# Patient Record
Sex: Female | Born: 1935 | Race: Black or African American | Hispanic: No | Marital: Single | Smoking: Never smoker
Health system: Southern US, Community
[De-identification: ages and names within clinical notes are randomized; demographics above are authoritative.]

## PROBLEM LIST (undated history)

## (undated) DIAGNOSIS — E119 Type 2 diabetes mellitus without complications: Secondary | ICD-10-CM

## (undated) DIAGNOSIS — I2699 Other pulmonary embolism without acute cor pulmonale: Secondary | ICD-10-CM

## (undated) DIAGNOSIS — F0390 Unspecified dementia without behavioral disturbance: Secondary | ICD-10-CM

## (undated) DIAGNOSIS — I82409 Acute embolism and thrombosis of unspecified deep veins of unspecified lower extremity: Secondary | ICD-10-CM

---

## 2010-08-09 ENCOUNTER — Emergency Department (HOSPITAL_COMMUNITY): Payer: Medicare Other

## 2010-08-09 ENCOUNTER — Emergency Department (HOSPITAL_COMMUNITY)
Admission: EM | Admit: 2010-08-09 | Discharge: 2010-08-09 | Disposition: A | Discharge status: Home / Self Care | Payer: Medicare Other | Attending: Emergency Medicine | Admitting: Emergency Medicine

## 2010-08-09 DIAGNOSIS — Z86711 Personal history of pulmonary embolism: Secondary | ICD-10-CM | POA: Insufficient documentation

## 2010-08-09 DIAGNOSIS — R29898 Other symptoms and signs involving the musculoskeletal system: Secondary | ICD-10-CM | POA: Insufficient documentation

## 2010-08-09 DIAGNOSIS — M19049 Primary osteoarthritis, unspecified hand: Secondary | ICD-10-CM | POA: Insufficient documentation

## 2010-08-09 DIAGNOSIS — E119 Type 2 diabetes mellitus without complications: Secondary | ICD-10-CM | POA: Insufficient documentation

## 2010-08-09 DIAGNOSIS — M79609 Pain in unspecified limb: Secondary | ICD-10-CM | POA: Insufficient documentation

## 2010-08-09 DIAGNOSIS — Z7901 Long term (current) use of anticoagulants: Secondary | ICD-10-CM | POA: Insufficient documentation

## 2010-08-09 DIAGNOSIS — M25439 Effusion, unspecified wrist: Secondary | ICD-10-CM | POA: Insufficient documentation

## 2010-08-09 DIAGNOSIS — R61 Generalized hyperhidrosis: Secondary | ICD-10-CM | POA: Insufficient documentation

## 2010-08-09 DIAGNOSIS — M25539 Pain in unspecified wrist: Secondary | ICD-10-CM | POA: Insufficient documentation

## 2010-08-09 DIAGNOSIS — M7989 Other specified soft tissue disorders: Secondary | ICD-10-CM | POA: Insufficient documentation

## 2010-08-09 LAB — DIFFERENTIAL
Eosinophils Absolute: 0.1 10*3/uL (ref 0.0–0.7)
Lymphs Abs: 1.7 10*3/uL (ref 0.7–4.0)
Monocytes Relative: 17 % — ABNORMAL HIGH (ref 3–12)
Neutro Abs: 3.6 10*3/uL (ref 1.7–7.7)
Neutrophils Relative %: 55 % (ref 43–77)

## 2010-08-09 LAB — BASIC METABOLIC PANEL
BUN: 12 mg/dL (ref 6–23)
CO2: 28 mEq/L (ref 19–32)
Calcium: 9.8 mg/dL (ref 8.4–10.5)
Creatinine, Ser: 0.69 mg/dL (ref 0.50–1.10)
Glucose, Bld: 88 mg/dL (ref 70–99)

## 2010-08-09 LAB — CBC
Hemoglobin: 11.1 g/dL — ABNORMAL LOW (ref 12.0–15.0)
MCH: 28 pg (ref 26.0–34.0)
MCV: 82.1 fL (ref 78.0–100.0)
RBC: 3.96 MIL/uL (ref 3.87–5.11)

## 2011-02-07 ENCOUNTER — Emergency Department (HOSPITAL_COMMUNITY)
Admission: EM | Admit: 2011-02-07 | Discharge: 2011-02-07 | Disposition: A | Discharge status: Home / Self Care | Payer: Medicare Other | Attending: Emergency Medicine | Admitting: Emergency Medicine

## 2011-02-07 ENCOUNTER — Emergency Department (HOSPITAL_COMMUNITY): Payer: Medicare Other

## 2011-02-07 ENCOUNTER — Encounter (HOSPITAL_COMMUNITY): Payer: Self-pay | Admitting: *Deleted

## 2011-02-07 DIAGNOSIS — M79609 Pain in unspecified limb: Secondary | ICD-10-CM

## 2011-02-07 DIAGNOSIS — Z86718 Personal history of other venous thrombosis and embolism: Secondary | ICD-10-CM | POA: Insufficient documentation

## 2011-02-07 DIAGNOSIS — M25469 Effusion, unspecified knee: Secondary | ICD-10-CM | POA: Insufficient documentation

## 2011-02-07 DIAGNOSIS — M7989 Other specified soft tissue disorders: Secondary | ICD-10-CM

## 2011-02-07 DIAGNOSIS — M25569 Pain in unspecified knee: Secondary | ICD-10-CM | POA: Insufficient documentation

## 2011-02-07 DIAGNOSIS — R609 Edema, unspecified: Secondary | ICD-10-CM | POA: Insufficient documentation

## 2011-02-07 HISTORY — DX: Acute embolism and thrombosis of unspecified deep veins of unspecified lower extremity: I82.409

## 2011-02-07 HISTORY — DX: Other pulmonary embolism without acute cor pulmonale: I26.99

## 2011-02-07 MED ORDER — ACETAMINOPHEN 325 MG PO TABS
650.0000 mg | ORAL_TABLET | Freq: Once | ORAL | Status: AC
  Administered 2011-02-07: 650 mg via ORAL
  Filled 2011-02-07: qty 2

## 2011-02-07 NOTE — ED Notes (Signed)
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

## 2011-02-07 NOTE — ED Provider Notes (Signed)
History     CSN: [id number]  Arrival date & time 02/07/11  1351   First MD Initiated Contact with Patient 02/07/11 1353      Chief Complaint  Patient presents with  . Knee Pain    right knee pain/edema    (Consider location/radiation/quality/duration/timing/severity/associated sxs/prior treatment) HPI History provided by pt and her daughter.  Patient's daughter reports that pt had a check-up w/ her PCP yesterday and was referred to ER for [place] to r/o RLE DVT.  They were unable to come d/t the weather.  Pt reports pain and edema of right knee.  She is not sure when sx started.  Her daughter does not recall her knee being swollen yesterday.  She had a fall prior to doctor's appointment but landed on her buttocks.  She is still bearing weight on this leg.  H/o DVT and PE; was taken off of coumadin approx 6 months ago.  Denies CP and SOB.      Past Medical History  Diagnosis Date  . DVT (deep venous thrombosis)   . Pulmonary embolism     History reviewed. No pertinent past surgical history.  History reviewed. No pertinent family history.  History  Substance Use Topics  . Smoking status: Not on file  . Smokeless tobacco: Not on file  . Alcohol Use: No    OB History    Grav Para Term Preterm Abortions TAB SAB Ect Mult Living                  Review of Systems  All other systems reviewed and are negative.    Allergies  Review of patient's allergies indicates no known allergies.  Home Medications  No current outpatient prescriptions on file.  BP 134/76  Pulse 76  Temp(Src) 97.8 F (36.6 C) (Oral)  Resp 18  SpO2 98%  Physical Exam  Nursing note and vitals reviewed. Constitutional: She is oriented to person, place, and time. She appears well-developed and well-nourished. No distress.  HENT:  Head: Normocephalic and atraumatic.  Eyes:       Normal appearance  Neck: Normal range of motion.  Cardiovascular: Normal rate and regular rhythm.   Pulmonary/Chest:  Effort normal and breath sounds normal.  Musculoskeletal:       Effusion of right knee.  No skin changes or warmth.  No edema or tenderness of calf, popliteal space or thigh.  Full ROM w/ mild pain.  2+ DP pulse and sensation intact.    Neurological: She is alert and oriented to person, place, and time.  Skin: Skin is warm and dry. No rash noted.  Psychiatric: She has a normal mood and affect. Her behavior is normal.    ED Course  Procedures (including critical care time)  Labs Reviewed - No data to display Dg Knee Complete 4 Views Right  02/07/2011  *RADIOLOGY REPORT*  Clinical Data: Pain and swelling in right knee, no known injury  RIGHT KNEE - COMPLETE 4+ VIEW  Comparison: None.  Findings:  Diffuse osteopenia without definite fracture.  Calcifications within the medial and lateral joint spaces compatible with chondrocalcinosis.  Joint spaces are grossly preserved.  No joint effusion.  Vascular calcifications.  IMPRESSION: 1.  Diffuse osteopenia without definite fracture. 2.  Chondrocalcinosis compatible with CPPD.  Original Report Authenticated By: Waynard Reeds, M.D.     No diagnosis found.    MDM  Pt presents w/ what appears to be a right knee joint effusion; likely has a sprain from  fall yesterday.  Has a h/o DVT and her PCP referred her to ED to have DVT ruled out.  Xray neg for fx.  Pt moved to CDU for LE venous doppler.  Will d/c home if negative.  Has received tylenol in ED and she has pain medication at home.          Otilio Miu, PA 02/07/11 1526  Arie Sabina [place], [place] 02/07/11 1526

## 2011-02-07 NOTE — Progress Notes (Signed)
Right:  No evidence of DVT, superficial thrombosis, or Baker's cyst.   Besse Miron D., RVS 02/07/2011 5:17PM

## 2011-02-07 NOTE — ED Notes (Signed)
MD at bedside at this time.

## 2011-02-07 NOTE — ED Provider Notes (Signed)
Medical screening examination/treatment/procedure(s) were conducted as a shared visit with non-physician practitioner(s) and myself.  I personally evaluated the patient during the encounter  Right knee effusion.  We'll give the patient orthopedic followup.  There is no signs of septic arthritis.  Given that the primary care doctor wanted an evaluation for DVT an ultrasound will be obtained however my suspicion for right lower committees DVT is very low  Lyanne Co, MD 02/07/11 [phone]

## 2011-02-07 NOTE — ED Notes (Signed)
Patient with c/o right knee pain, patient seen by PMD and was to have ultrasound yesterday but could not due to weather.  Patient with history of DVT/PE.  Patient was prescribed coumadin for past DVT but removed from drug approx 6 months ago

## 2011-02-07 NOTE — ED Provider Notes (Signed)
3:30 PM Assumed care of patient in the CDU.  Patient discussed with Annett Fabian, PA-C.  Patient is a 78 year old female who comes in today with right knee pain.  She was sent here by her PCP for evaluation of a possible DVT.  Patient had a negative knee xray.  Patient awaiting right lower extremity doppler.  Plan is for patient to be discharged home if Doppler is negative. Reassessed patient.  Patient in no acute distress.  Patient alert and orientated x 3.  Heart RRR, Lungs CTAB.  Mild right knee effusion, no erythema or warmth.  Full ROM of right knee.  No lower extremity edema, erythema, or tenderness..  Negative Homan's sign.  6:05 PM Lower extremity doppler of right LE negative.  Results discussed with patient and family.  Will discharge patient home and recommend follow up with PCP in a couple of days.  Pascal Lux [place], PA-C 02/16/11 1901

## 2011-02-16 NOTE — ED Provider Notes (Signed)
Medical screening examination/treatment/procedure(s) were conducted as a shared visit with non-physician practitioner(s) and myself.  I personally evaluated the patient during the encounter   Lyanne Co, MD 02/16/11 [phone]

## 2011-04-05 ENCOUNTER — Encounter (HOSPITAL_COMMUNITY): Payer: Self-pay | Admitting: Physical Medicine and Rehabilitation

## 2011-04-05 ENCOUNTER — Emergency Department (HOSPITAL_COMMUNITY)
Admission: EM | Admit: 2011-04-05 | Discharge: 2011-04-05 | Disposition: A | Discharge status: Home / Self Care | Payer: Medicare Other | Attending: Emergency Medicine | Admitting: Emergency Medicine

## 2011-04-05 ENCOUNTER — Emergency Department (HOSPITAL_COMMUNITY): Payer: Medicare Other

## 2011-04-05 ENCOUNTER — Other Ambulatory Visit: Payer: Self-pay

## 2011-04-05 DIAGNOSIS — R404 Transient alteration of awareness: Secondary | ICD-10-CM

## 2011-04-05 DIAGNOSIS — Z86718 Personal history of other venous thrombosis and embolism: Secondary | ICD-10-CM | POA: Insufficient documentation

## 2011-04-05 DIAGNOSIS — R4182 Altered mental status, unspecified: Secondary | ICD-10-CM | POA: Insufficient documentation

## 2011-04-05 LAB — BASIC METABOLIC PANEL
CO2: 23 mEq/L (ref 19–32)
Calcium: 9.8 mg/dL (ref 8.4–10.5)
Chloride: 99 mEq/L (ref 96–112)
Creatinine, Ser: 0.95 mg/dL (ref 0.50–1.10)
Glucose, Bld: 109 mg/dL — ABNORMAL HIGH (ref 70–99)
Sodium: 135 mEq/L (ref 135–145)

## 2011-04-05 LAB — URINALYSIS, ROUTINE W REFLEX MICROSCOPIC
Bilirubin Urine: NEGATIVE
Hgb urine dipstick: NEGATIVE
Specific Gravity, Urine: 1.015 (ref 1.005–1.030)
pH: 6 (ref 5.0–8.0)

## 2011-04-05 LAB — CBC
Hemoglobin: 13.9 g/dL (ref 12.0–15.0)
MCH: 29.4 pg (ref 26.0–34.0)
MCV: 87.5 fL (ref 78.0–100.0)
RBC: 4.72 MIL/uL (ref 3.87–5.11)

## 2011-04-05 LAB — URINE MICROSCOPIC-ADD ON

## 2011-04-05 NOTE — ED Notes (Signed)
Pt presents to department for evaluation of altered mental status. She was at home today with family when she had episode of garbled speech and was unable to answer questions. Daughter states this lasted several minutes. Also states pt began "rocking" back and forth. Upon arrival per daughter she returned to normal neuro baseline. She is alert and can answer simple questions, confused about situation. Strong equal bilateral grip strengths. Pupils round and reactive. Able to move all extremities. Speech clear. Denies pain at the time.

## 2011-04-05 NOTE — ED Provider Notes (Signed)
History     CSN: [id number]  Arrival date & time 04/05/11  1047   First MD Initiated Contact with Patient 04/05/11 1054      Chief Complaint  Patient presents with  . Altered Mental Status    (Consider location/radiation/quality/duration/timing/severity/associated sxs/prior treatment) HPI  Pt presents to the ED with complaints of altered mental status while going to the bathroom. Per the patients family members, she was going to the bathroom and for about 5 minutes she was rocking back and forth and mumbling. The patient has never done this before per the family member. Upon arrival to the ED the patient was back to baseline. At this time the patient is alert and oriented and is answering questions appropriately. She claims that she remembers what happened but does not know why. She denies chest pain or abdominal pain. She denies having any complaints at all.  Past Medical History  Diagnosis Date  . DVT (deep venous thrombosis)   . Pulmonary embolism     No past surgical history on file.  No family history on file.  History  Substance Use Topics  . Smoking status: Never Smoker   . Smokeless tobacco: Not on file  . Alcohol Use: No    OB History    Grav Para Term Preterm Abortions TAB SAB Ect Mult Living                  Review of Systems  Unable to perform ROS   Allergies  Review of patient's allergies indicates no known allergies.  Home Medications   Current Outpatient Rx  Name Route Sig Dispense Refill  . ACETAMINOPHEN 325 MG PO TABS Oral Take 650 mg by mouth every 6 (six) hours as needed. For pain    . AZITHROMYCIN 1 % OP SOLN Both Eyes Place 1 drop into both eyes 3 (three) times daily.    Marland Kitchen DEXTROMETHORPHAN POLISTIREX ER 30 MG/5ML PO LQCR Oral Take 60 mg by mouth 2 (two) times daily.    . ERYTHROMYCIN 5 MG/GM OP OINT Both Eyes Place 1 application into both eyes 2 (two) times daily.    Marland Kitchen FERROUS FUMARATE 325 (106 FE) MG PO TABS Oral Take 1 tablet by mouth.     Marland Kitchen LORATADINE 10 MG PO TABS Oral Take 10 mg by mouth daily.    Marland Kitchen VITAMIN C 500 MG PO TABS Oral Take 500 mg by mouth 2 (two) times daily.       BP 104/55  Pulse 76  Temp(Src) 98.6 F (37 C) (Axillary)  Resp 18  SpO2 96%  Physical Exam  Nursing note and vitals reviewed. Constitutional: She appears well-developed and well-nourished. No distress.  HENT:  Head: Normocephalic and atraumatic.  Eyes: Pupils are equal, round, and reactive to light.  Neck: Normal range of motion. Neck supple.  Cardiovascular: Normal rate and regular rhythm.   Pulmonary/Chest: Effort normal.  Abdominal: Soft.  Neurological: She is alert.  Skin: Skin is warm and dry. She is not diaphoretic.    ED Course  Procedures (including critical care time)  Labs Reviewed  URINALYSIS, ROUTINE W REFLEX MICROSCOPIC - Abnormal; Notable for the following:    Protein, ur 30 (*)    All other components within normal limits  CBC - Abnormal; Notable for the following:    Platelets 139 (*)    All other components within normal limits  BASIC METABOLIC PANEL - Abnormal; Notable for the following:    Glucose, Bld 109 (*)  GFR calc non Af Amer 51 (*)    GFR calc Af Amer 59 (*)    All other components within normal limits  URINE MICROSCOPIC-ADD ON - Abnormal; Notable for the following:    Bacteria, UA FEW (*)    Casts HYALINE CASTS (*)    All other components within normal limits  TROPONIN I   Ct Head Wo Contrast  04/05/2011  *RADIOLOGY REPORT*  Clinical Data: TIA, with slurred speech earlier today which has resolved.  Acute mental status changes currently with confusion.  CT HEAD WITHOUT CONTRAST 04/05/2011:  Technique:  Contiguous axial images were obtained from the base of the skull through the vertex without contrast.  Comparison: None.  Findings: Moderate cortical atrophy and mild deep atrophy.  Mild changes of small vessel disease of the white matter.  No mass lesion.  No midline shift.  No acute hemorrhage or  hematoma.  No extra-axial fluid collections.  No evidence of acute infarction. Physiologic calcifications in the basal ganglia bilaterally.  No focal osseous abnormality involving the skull.  Air-fluid level in the right maxillary sinus with opacification of bilateral ethmoid air cells.  Remaining visualized paranasal sinuses, mastoid air cells, and middle ear cavities well-aerated.  Extensive bilateral carotid siphon atherosclerosis.  Bilateral scleral buckle procedures.  IMPRESSION:  1.  No acute intracranial abnormalities. 2.  Moderate cortical atrophy and mild deep atrophy.  Mild chronic microvascular ischemic changes of white matter. 3.  Acute right maxillary sinus and acute versus chronic bilateral ethmoid sinusitis.  Original Report Authenticated By: Arnell Sieving, M.D.     1. Altered awareness, transient       MDM     Date: 04/05/2011  Rate: 83  Rhythm: normal sinus rhythm  QRS Axis: normal  Intervals: normal  ST/T Wave abnormalities: normal  Conduction Disutrbances:none and borderline AV conduction delay  Narrative Interpretation: Ventricular premature complex  Old EKG Reviewed: none available   Pt discussed with Dr. Karma Ganja who has examined the patient as well. Her work-up for AMS has come back with no significant findings. We believe that the patient is safe to go home at this time and should follow-up with her PCP this week.  Pt has been advised of the symptoms that warrant their return to the ED. Patient has voiced understanding and has agreed to follow-up with the PCP or specialist.       Dorthula Matas, PA 04/05/11 1338

## 2011-04-05 NOTE — ED Notes (Signed)
Pt presents to department for evaluation of altered mental status. Per family she was being assisted to bathroom when she became confused and was unable to answer questions. Episode lasting 10-15 seconds. Upon arrival she is alert and oriented x4. Denies pain.

## 2011-04-05 NOTE — ED Notes (Signed)
Pt back from CT-Returned to monitoring equipment and cycled BP 104/55

## 2011-04-05 NOTE — ED Notes (Signed)
Pt discharged home, wheelchair to discharge. Had no further questions.

## 2011-04-05 NOTE — ED Notes (Signed)
Pt resting quietly at the time. Remains on cardiac monitor. Vital signs stable. Denies pain at the time. No change in neuro status.

## 2011-04-05 NOTE — ED Notes (Signed)
Pt to CT scan via Tech

## 2011-04-05 NOTE — ED Provider Notes (Signed)
Medical screening examination/treatment/procedure(s) were conducted as a shared visit with non-physician practitioner(s) and myself.  I personally evaluated the patient during the encounter  Pt seen and evaluated.  Pt and family feel she is back to her baseline after a brief episode of mumbling and not answering questions at home.  Workup in ED reassuring- no acute abnormalities found.  Ethelda Chick, MD 04/05/11 [phone]

## 2011-07-27 ENCOUNTER — Other Ambulatory Visit: Payer: Self-pay | Admitting: Ophthalmology

## 2011-07-27 NOTE — H&P (Signed)
Pre-operative History and Physical for Ophthalmic Surgery  Roberta White 07/27/2011                  Chief Complaint: Decreased vision  Diagnosis: Mature Cataract  No Known Allergies    Prior to Admission medications   Medication Sig Start Date End Date Taking? Authorizing Provider  acetaminophen (TYLENOL) 325 MG tablet Take 650 mg by mouth every 6 (six) hours as needed. For pain    Historical Provider, MD  azithromycin (AZASITE) 1 % ophthalmic solution Place 1 drop into both eyes 3 (three) times daily.    Historical Provider, MD  dextromethorphan (DELSYM) 30 MG/5ML liquid Take 60 mg by mouth 2 (two) times daily.    Historical Provider, MD  erythromycin ophthalmic ointment Place 1 application into both eyes 2 (two) times daily.    Historical Provider, MD  ferrous fumarate (HEMOCYTE - 106 MG FE) 325 (106 FE) MG TABS Take 1 tablet by mouth.    Historical Provider, MD  loratadine (CLARITIN) 10 MG tablet Take 10 mg by mouth daily.    Historical Provider, MD  vitamin C (ASCORBIC ACID) 500 MG tablet Take 500 mg by mouth 2 (two) times daily.     Historical Provider, MD    Planned Procedure:                                       Phacoemulsification, Posterior Chamber Intra-ocular Lens Left Eye                                      Acrysof MA50BM  +19.50 Diopter PC IOL  There were no vitals filed for this visit.  Pulse: 80         Temp: NA        Resp:  18      ROS: negative   Past Medical History  Diagnosis Date  . DVT (deep venous thrombosis)   . Pulmonary embolism     No past surgical history on file.   History   Social History  . Marital Status: Single    Spouse Name: N/A    Number of Children: N/A  . Years of Education: N/A   Occupational History  . Not on file.   Social History Main Topics  . Smoking status: Never Smoker   . Smokeless tobacco: Not on file  . Alcohol Use: No  . Drug Use: No  . Sexually Active:    Other Topics Concern  . Not on file   Social  History Narrative  . No narrative on file     The following examination is for anesthesia clearance for minimally invasive Ophthalmic surgery. It is primarily to document heart and lung findings and is not intended to elucidate unknown general medical conditions inclusive of abdominal masses, lung lesions, etc.   General Constitution:  frail   Alertness/Orientation:  Person, time place     yes , alzheimer's  HEENT:  Eye Findings: Cataract                   left eye  Neck: supple without masses  Chest/Lungs: clear to auscultation  Cardiac: Normal S1 and S2 without Murmur, S3 or S4  Neuro: non-focal   Impression: cataract   Planned Procedure:  Phacoemulsification, Posterior Chamber Intraocular Lens  Left eye    Shade Flood, MD

## 2011-08-11 ENCOUNTER — Encounter (HOSPITAL_COMMUNITY): Payer: Self-pay

## 2011-08-11 ENCOUNTER — Encounter (HOSPITAL_COMMUNITY)
Admission: RE | Admit: 2011-08-11 | Discharge: 2011-08-11 | Payer: Medicare Other | Source: Ambulatory Visit | Attending: Ophthalmology | Admitting: Ophthalmology

## 2011-08-11 ENCOUNTER — Encounter (HOSPITAL_COMMUNITY): Payer: Self-pay | Admitting: Pharmacist

## 2011-08-11 HISTORY — DX: Unspecified dementia, unspecified severity, without behavioral disturbance, psychotic disturbance, mood disturbance, and anxiety: F03.90

## 2011-08-11 MED ORDER — GATIFLOXACIN 0.5 % OP SOLN
1.0000 [drp] | OPHTHALMIC | Status: AC | PRN
  Administered 2011-08-12 (×3): 1 [drp] via OPHTHALMIC
  Filled 2011-08-11: qty 2.5

## 2011-08-11 MED ORDER — PHENYLEPHRINE HCL 2.5 % OP SOLN
1.0000 [drp] | OPHTHALMIC | Status: AC | PRN
  Administered 2011-08-12 (×3): 1 [drp] via OPHTHALMIC
  Filled 2011-08-11: qty 3

## 2011-08-11 MED ORDER — TETRACAINE HCL 0.5 % OP SOLN
2.0000 [drp] | OPHTHALMIC | Status: AC
  Administered 2011-08-12: 2 [drp] via OPHTHALMIC
  Filled 2011-08-11: qty 2

## 2011-08-11 MED ORDER — PREDNISOLONE ACETATE 1 % OP SUSP
1.0000 [drp] | OPHTHALMIC | Status: AC
  Administered 2011-08-12: 1 [drp] via OPHTHALMIC
  Filled 2011-08-11: qty 5

## 2011-08-12 ENCOUNTER — Ambulatory Visit (HOSPITAL_COMMUNITY): Payer: Medicare Other | Admitting: Certified Registered"

## 2011-08-12 ENCOUNTER — Encounter (HOSPITAL_COMMUNITY): Payer: Self-pay | Admitting: Certified Registered"

## 2011-08-12 ENCOUNTER — Encounter (HOSPITAL_COMMUNITY)
Admission: RE | Disposition: A | Discharge status: Home / Self Care | Payer: Self-pay | Source: Ambulatory Visit | Attending: Ophthalmology

## 2011-08-12 ENCOUNTER — Ambulatory Visit (HOSPITAL_COMMUNITY): Payer: Medicare Other

## 2011-08-12 ENCOUNTER — Ambulatory Visit (HOSPITAL_COMMUNITY)
Admission: RE | Admit: 2011-08-12 | Discharge: 2011-08-12 | Disposition: A | Discharge status: Home / Self Care | Payer: Medicare Other | Source: Ambulatory Visit | Attending: Ophthalmology | Admitting: Ophthalmology

## 2011-08-12 ENCOUNTER — Encounter (HOSPITAL_COMMUNITY): Payer: Self-pay

## 2011-08-12 DIAGNOSIS — Z01818 Encounter for other preprocedural examination: Secondary | ICD-10-CM | POA: Insufficient documentation

## 2011-08-12 DIAGNOSIS — H268 Other specified cataract: Secondary | ICD-10-CM | POA: Insufficient documentation

## 2011-08-12 HISTORY — PX: CATARACT EXTRACTION W/PHACO: SHX586

## 2011-08-12 LAB — BASIC METABOLIC PANEL
BUN: 11 mg/dL (ref 6–23)
CO2: 31 mEq/L (ref 19–32)
Calcium: 9.9 mg/dL (ref 8.4–10.5)
GFR calc non Af Amer: 62 mL/min — ABNORMAL LOW (ref >90)
Glucose, Bld: 99 mg/dL (ref 70–99)
Potassium: 3.9 mEq/L (ref 3.5–5.1)

## 2011-08-12 LAB — CBC
Hemoglobin: 14.5 g/dL (ref 12.0–15.0)
MCH: 31 pg (ref 26.0–34.0)
MCHC: 35.5 g/dL (ref 30.0–36.0)
MCV: 87.6 fL (ref 78.0–100.0)

## 2011-08-12 SURGERY — PHACOEMULSIFICATION, CATARACT, WITH IOL INSERTION
Anesthesia: General | Site: Eye | Laterality: Left | Wound class: Clean Contaminated

## 2011-08-12 MED ORDER — PHENYLEPHRINE HCL 10 MG/ML IJ SOLN
INTRAMUSCULAR | Status: DC | PRN
  Administered 2011-08-12: 40 ug via INTRAVENOUS

## 2011-08-12 MED ORDER — NEOSTIGMINE METHYLSULFATE 1 MG/ML IJ SOLN
INTRAMUSCULAR | Status: DC | PRN
  Administered 2011-08-12: 4 mg via INTRAVENOUS

## 2011-08-12 MED ORDER — EPINEPHRINE HCL 1 MG/ML IJ SOLN
INTRAMUSCULAR | Status: AC
  Filled 2011-08-12: qty 1

## 2011-08-12 MED ORDER — GLYCOPYRROLATE 0.2 MG/ML IJ SOLN
INTRAMUSCULAR | Status: DC | PRN
  Administered 2011-08-12: .6 mg via INTRAVENOUS

## 2011-08-12 MED ORDER — ONDANSETRON HCL 4 MG/2ML IJ SOLN
INTRAMUSCULAR | Status: DC | PRN
  Administered 2011-08-12: 4 mg via INTRAVENOUS

## 2011-08-12 MED ORDER — SODIUM CHLORIDE 0.9 % IV SOLN
INTRAVENOUS | Status: DC | PRN
  Administered 2011-08-12: 15:00:00 via INTRAVENOUS

## 2011-08-12 MED ORDER — BACITRACIN-POLYMYXIN B 500-10000 UNIT/GM OP OINT
TOPICAL_OINTMENT | OPHTHALMIC | Status: AC
  Filled 2011-08-12: qty 3.5

## 2011-08-12 MED ORDER — BSS IO SOLN
INTRAOCULAR | Status: AC
  Filled 2011-08-12: qty 500

## 2011-08-12 MED ORDER — TRYPAN BLUE 0.15 % OP SOLN
1.0000 "application " | Freq: Once | OPHTHALMIC | Status: AC
  Administered 2011-08-12: 1 via INTRAOCULAR
  Filled 2011-08-12: qty 0.5

## 2011-08-12 MED ORDER — SODIUM CHLORIDE 0.9 % IV SOLN
INTRAVENOUS | Status: DC
  Administered 2011-08-12: 14:00:00 via INTRAVENOUS

## 2011-08-12 MED ORDER — LIDOCAINE HCL (CARDIAC) 20 MG/ML IV SOLN
INTRAVENOUS | Status: DC | PRN
  Administered 2011-08-12: 100 mg via INTRAVENOUS

## 2011-08-12 MED ORDER — SODIUM HYALURONATE 10 MG/ML IO SOLN
INTRAOCULAR | Status: AC
  Filled 2011-08-12: qty 0.85

## 2011-08-12 MED ORDER — EPINEPHRINE HCL 1 MG/ML IJ SOLN
INTRAOCULAR | Status: DC | PRN
  Administered 2011-08-12: 15:00:00

## 2011-08-12 MED ORDER — EPHEDRINE SULFATE 50 MG/ML IJ SOLN
INTRAMUSCULAR | Status: DC | PRN
  Administered 2011-08-12 (×2): 5 mg via INTRAVENOUS

## 2011-08-12 MED ORDER — FENTANYL CITRATE 0.05 MG/ML IJ SOLN
INTRAMUSCULAR | Status: DC | PRN
  Administered 2011-08-12: 50 ug via INTRAVENOUS

## 2011-08-12 MED ORDER — NA CHONDROIT SULF-NA HYALURON 40-30 MG/ML IO SOLN
INTRAOCULAR | Status: DC | PRN
  Administered 2011-08-12: 0.5 mL via INTRAOCULAR

## 2011-08-12 MED ORDER — BUPIVACAINE HCL 0.75 % IJ SOLN
INTRAMUSCULAR | Status: DC | PRN
  Administered 2011-08-12: 4 mL

## 2011-08-12 MED ORDER — PROVISC 10 MG/ML IO SOLN
INTRAOCULAR | Status: DC | PRN
  Administered 2011-08-12: .85 mL via INTRAOCULAR

## 2011-08-12 MED ORDER — DEXAMETHASONE SODIUM PHOSPHATE 10 MG/ML IJ SOLN
INTRAMUSCULAR | Status: DC | PRN
  Administered 2011-08-12: 5 mg

## 2011-08-12 MED ORDER — PROPOFOL 10 MG/ML IV EMUL
INTRAVENOUS | Status: DC | PRN
  Administered 2011-08-12: 90 mg via INTRAVENOUS

## 2011-08-12 MED ORDER — ROCURONIUM BROMIDE 100 MG/10ML IV SOLN
INTRAVENOUS | Status: DC | PRN
  Administered 2011-08-12: 25 mg via INTRAVENOUS

## 2011-08-12 MED ORDER — LIDOCAINE HCL 2 % IJ SOLN
INTRAMUSCULAR | Status: AC
  Filled 2011-08-12: qty 1

## 2011-08-12 MED ORDER — BUPIVACAINE HCL 0.75 % IJ SOLN
INTRAMUSCULAR | Status: AC
  Filled 2011-08-12: qty 10

## 2011-08-12 MED ORDER — NA CHONDROIT SULF-NA HYALURON 40-30 MG/ML IO SOLN
INTRAOCULAR | Status: AC
  Filled 2011-08-12: qty 0.5

## 2011-08-12 MED ORDER — CEFAZOLIN SUBCONJUNCTIVAL INJECTION 100 MG/0.5 ML
200.0000 mg | INJECTION | Freq: Once | SUBCONJUNCTIVAL | Status: AC
  Administered 2011-08-12: 100 mg via SUBCONJUNCTIVAL
  Filled 2011-08-12: qty 1

## 2011-08-12 MED ORDER — MIDAZOLAM HCL 2 MG/2ML IJ SOLN
INTRAMUSCULAR | Status: AC
  Filled 2011-08-12: qty 2

## 2011-08-12 MED ORDER — HYPROMELLOSE (GONIOSCOPIC) 2.5 % OP SOLN
OPHTHALMIC | Status: DC | PRN
  Administered 2011-08-12: 15 [drp] via OPHTHALMIC

## 2011-08-12 MED ORDER — BACITRACIN-POLYMYXIN B 500-10000 UNIT/GM OP OINT
TOPICAL_OINTMENT | OPHTHALMIC | Status: DC | PRN
  Administered 2011-08-12: 1 via OPHTHALMIC

## 2011-08-12 MED ORDER — HYDROMORPHONE HCL PF 1 MG/ML IJ SOLN: 0.2500 mg | INTRAMUSCULAR | Status: DC | PRN

## 2011-08-12 MED ORDER — TETRACAINE HCL 0.5 % OP SOLN
OPHTHALMIC | Status: AC
  Filled 2011-08-12: qty 2

## 2011-08-12 MED ORDER — MIDAZOLAM HCL 2 MG/2ML IJ SOLN
0.5000 mg | Freq: Once | INTRAMUSCULAR | Status: AC
  Administered 2011-08-12: 0.5 mg via INTRAVENOUS

## 2011-08-12 MED ORDER — ONDANSETRON HCL 4 MG/2ML IJ SOLN: 4.0000 mg | Freq: Once | INTRAMUSCULAR | Status: DC | PRN

## 2011-08-12 MED ORDER — LIDOCAINE HCL 4 % MT SOLN
OROMUCOSAL | Status: DC | PRN
  Administered 2011-08-12: 4 mL via TOPICAL

## 2011-08-12 MED ORDER — HYPROMELLOSE (GONIOSCOPIC) 2.5 % OP SOLN
OPHTHALMIC | Status: AC
  Filled 2011-08-12: qty 15

## 2011-08-12 MED ORDER — DEXAMETHASONE SODIUM PHOSPHATE 10 MG/ML IJ SOLN
INTRAMUSCULAR | Status: AC
  Filled 2011-08-12: qty 1

## 2011-08-12 MED ORDER — BALANCED SALT IO SOLN
INTRAOCULAR | Status: DC | PRN
  Administered 2011-08-12: 15 mL via INTRAOCULAR

## 2011-08-12 SURGICAL SUPPLY — 59 items
APPLICATOR COTTON TIP 6IN STRL (MISCELLANEOUS) ×2 IMPLANT
APPLICATOR DR MATTHEWS STRL (MISCELLANEOUS) IMPLANT
BAG MINI COLL DRAIN (WOUND CARE) ×2 IMPLANT
BLADE EYE MINI 60D BEAVER (BLADE) IMPLANT
BLADE KERATOME 2.75 (BLADE) ×2 IMPLANT
BLADE STAB KNIFE 15DEG (BLADE) ×2 IMPLANT
CANNULA ANTERIOR CHAMBER 27GA (MISCELLANEOUS) ×2 IMPLANT
CLOTH BEACON ORANGE TIMEOUT ST (SAFETY) ×2 IMPLANT
DRAPE OPHTHALMIC 77X100 STRL (CUSTOM PROCEDURE TRAY) ×2 IMPLANT
DRAPE POUCH INSTRU U-SHP 10X18 (DRAPES) ×2 IMPLANT
DRSG TEGADERM 4X4.75 (GAUZE/BANDAGES/DRESSINGS) ×2 IMPLANT
FILTER BLUE MILLIPORE (MISCELLANEOUS) IMPLANT
GLOVE ECLIPSE 6.5 STRL STRAW (GLOVE) ×2 IMPLANT
GLOVE SS BIOGEL STRL SZ 6.5 (GLOVE) ×1 IMPLANT
GLOVE SUPERSENSE BIOGEL SZ 6.5 (GLOVE) ×1
GOWN SRG XL XLNG 56XLVL 4 (GOWN DISPOSABLE) ×1 IMPLANT
GOWN STRL NON-REIN LRG LVL3 (GOWN DISPOSABLE) ×2 IMPLANT
GOWN STRL NON-REIN XL XLG LVL4 (GOWN DISPOSABLE) ×1
KIT BASIN OR (CUSTOM PROCEDURE TRAY) ×2 IMPLANT
KIT ROOM TURNOVER OR (KITS) IMPLANT
KNIFE GRIESHABER SHARP 2.5MM (MISCELLANEOUS) ×2 IMPLANT
LENS IOL ACRYSOF MP POST 19.5 (Intraocular Lens) ×2 IMPLANT
MASK EYE SHIELD (GAUZE/BANDAGES/DRESSINGS) ×2 IMPLANT
NEEDLE 18GX1X1/2 (RX/OR ONLY) (NEEDLE) IMPLANT
NEEDLE 22X1 1/2 (OR ONLY) (NEEDLE) ×2 IMPLANT
NEEDLE 25GX 5/8IN NON SAFETY (NEEDLE) ×2 IMPLANT
NEEDLE FILTER BLUNT 18X 1/2SAF (NEEDLE) ×1
NEEDLE FILTER BLUNT 18X1 1/2 (NEEDLE) ×1 IMPLANT
NEEDLE HYPO 30X.5 LL (NEEDLE) ×4 IMPLANT
NS IRRIG 1000ML POUR BTL (IV SOLUTION) ×2 IMPLANT
PACK CATARACT CUSTOM (CUSTOM PROCEDURE TRAY) ×2 IMPLANT
PACK CATARACT MCHSCP (PACKS) ×2 IMPLANT
PACK COMBINED CATERACT/VIT 23G (OPHTHALMIC RELATED) IMPLANT
PAD ARMBOARD 7.5X6 YLW CONV (MISCELLANEOUS) ×4 IMPLANT
PAD EYE OVAL STERILE LF (GAUZE/BANDAGES/DRESSINGS) ×2 IMPLANT
PHACO TIP KELMAN 45DEG (TIP) ×2 IMPLANT
PROBE ANTERIOR 20G W/INFUS NDL (MISCELLANEOUS) IMPLANT
ROLLS DENTAL (MISCELLANEOUS) IMPLANT
SHUTTLE MONARCH TYPE A (NEEDLE) ×2 IMPLANT
SOLUTION ANTI FOG 6CC (MISCELLANEOUS) ×2 IMPLANT
SPEAR EYE SURG WECK-CEL (MISCELLANEOUS) ×2 IMPLANT
SUT ETHILON 10-0 CS-B-6CS-B-6 (SUTURE)
SUT ETHILON 5 0 P 3 18 (SUTURE)
SUT ETHILON 9 0 TG140 8 (SUTURE) IMPLANT
SUT NYLON ETHILON 5-0 P-3 1X18 (SUTURE) IMPLANT
SUT PLAIN 6 0 TG1408 (SUTURE) IMPLANT
SUT POLY NON ABSORB 10-0 8 STR (SUTURE) IMPLANT
SUT VICRYL 6 0 S 29 12 (SUTURE) IMPLANT
SUTURE EHLN 10-0 CS-B-6CS-B-6 (SUTURE) IMPLANT
SYR 20CC LL (SYRINGE) IMPLANT
SYR 5ML LL (SYRINGE) IMPLANT
SYR TB 1ML LUER SLIP (SYRINGE) IMPLANT
SYRINGE 10CC LL (SYRINGE) IMPLANT
TAPE PAPER 2X10 WHT MICROPORE (GAUZE/BANDAGES/DRESSINGS) ×2 IMPLANT
TAPE SURG TRANSPORE 1 IN (GAUZE/BANDAGES/DRESSINGS) ×1 IMPLANT
TAPE SURGICAL TRANSPORE 1 IN (GAUZE/BANDAGES/DRESSINGS) ×1
TOWEL OR 17X24 6PK STRL BLUE (TOWEL DISPOSABLE) ×4 IMPLANT
WATER STERILE IRR 1000ML POUR (IV SOLUTION) ×2 IMPLANT
WIPE INSTRUMENT VISIWIPE 73X73 (MISCELLANEOUS) ×2 IMPLANT

## 2011-08-12 NOTE — Transfer of Care (Signed)
Immediate Anesthesia Transfer of Care Note  Patient: Roberta White  Procedure(s) Performed: Procedure(s) (LRB): CATARACT EXTRACTION PHACO AND INTRAOCULAR LENS PLACEMENT (IOC) (Left)  Patient Location: PACU  Anesthesia Type: General  Level of Consciousness: confused  Airway & Oxygen Therapy: Patient connected to face mask oxygen  Post-op Assessment: Report given to PACU RN and Post -op Vital signs reviewed and stable  Post vital signs: Reviewed and stable  Complications: No apparent anesthesia complications

## 2011-08-12 NOTE — H&P (View-Only) (Signed)
Pre-operative History and Physical for Ophthalmic Surgery  Samah Hoffmann 07/27/2011                  Chief Complaint: Decreased vision  Diagnosis: Mature Cataract  No Known Allergies    Prior to Admission medications   Medication Sig Start Date End Date Taking? Authorizing Provider  acetaminophen (TYLENOL) 325 MG tablet Take 650 mg by mouth every 6 (six) hours as needed. For pain    Historical Provider, MD  azithromycin (AZASITE) 1 % ophthalmic solution Place 1 drop into both eyes 3 (three) times daily.    Historical Provider, MD  dextromethorphan (DELSYM) 30 MG/5ML liquid Take 60 mg by mouth 2 (two) times daily.    Historical Provider, MD  erythromycin ophthalmic ointment Place 1 application into both eyes 2 (two) times daily.    Historical Provider, MD  ferrous fumarate (HEMOCYTE - 106 MG FE) 325 (106 FE) MG TABS Take 1 tablet by mouth.    Historical Provider, MD  loratadine (CLARITIN) 10 MG tablet Take 10 mg by mouth daily.    Historical Provider, MD  vitamin C (ASCORBIC ACID) 500 MG tablet Take 500 mg by mouth 2 (two) times daily.     Historical Provider, MD    Planned Procedure:                                       Phacoemulsification, Posterior Chamber Intra-ocular Lens Left Eye                                      Acrysof MA50BM  +19.50 Diopter PC IOL  There were no vitals filed for this visit.  Pulse: 80         Temp: NA        Resp:  18      ROS: negative   Past Medical History  Diagnosis Date  . DVT (deep venous thrombosis)   . Pulmonary embolism     No past surgical history on file.   History   Social History  . Marital Status: Single    Spouse Name: N/A    Number of Children: N/A  . Years of Education: N/A   Occupational History  . Not on file.   Social History Main Topics  . Smoking status: Never Smoker   . Smokeless tobacco: Not on file  . Alcohol Use: No  . Drug Use: No  . Sexually Active:    Other Topics Concern  . Not on file   Social  History Narrative  . No narrative on file     The following examination is for anesthesia clearance for minimally invasive Ophthalmic surgery. It is primarily to document heart and lung findings and is not intended to elucidate unknown general medical conditions inclusive of abdominal masses, lung lesions, etc.   General Constitution:  frail   Alertness/Orientation:  Person, time place     yes , alzheimer's  HEENT:  Eye Findings: Cataract                   left eye  Neck: supple without masses  Chest/Lungs: clear to auscultation  Cardiac: Normal S1 and S2 without Murmur, S3 or S4  Neuro: non-focal   Impression: cataract   Planned Procedure:  Phacoemulsification, Posterior Chamber Intraocular Lens                                         Left eye    Arval Brandstetter, MD        

## 2011-08-12 NOTE — Anesthesia Preprocedure Evaluation (Addendum)
Anesthesia Evaluation  Patient identified by MRN, date of birth, ID band Patient awake and Patient confused    Reviewed: Allergy & Precautions, H&P , NPO status , Patient's Chart, lab work & pertinent test results, reviewed documented beta blocker date and time   Airway Mallampati: II TM Distance: >3 FB Neck ROM: Full    Dental  (+) Dental Advisory Given   Pulmonary          Cardiovascular Exercise Tolerance: Poor DVT Rhythm:regular Rate:Normal     Neuro/Psych PSYCHIATRIC DISORDERS    GI/Hepatic   Endo/Other    Renal/GU      Musculoskeletal   Abdominal   Peds  Hematology   Anesthesia Other Findings Dementia  Reproductive/Obstetrics                          Anesthesia Physical Anesthesia Plan  ASA: II  Anesthesia Plan: General   Post-op Pain Management:    Induction: Intravenous  Airway Management Planned: Oral ETT  Additional Equipment:   Intra-op Plan:   Post-operative Plan: Extubation in OR  Informed Consent: I have reviewed the patients History and Physical, chart, labs and discussed the procedure including the risks, benefits and alternatives for the proposed anesthesia with the patient or authorized representative who has indicated his/her understanding and acceptance.     Plan Discussed with: CRNA, Anesthesiologist and Surgeon  Anesthesia Plan Comments:         Anesthesia Quick Evaluation

## 2011-08-12 NOTE — Preoperative (Signed)
Beta Blockers   Reason not to administer Beta Blockers:Not Applicable 

## 2011-08-12 NOTE — Op Note (Signed)
Roberta White 08/12/2011 Combined Cataract  Procedure: Phacoemulsification, Posterior Chamber Intra-ocular Lens Operative Eye:  left eye  Surgeon: Shade Flood Estimated Blood Loss: minimal Specimens for Pathology:  None Complications: none  The patient was prepared and draped in the usual manner for ocular surgery on the left eye. A Cook lid speculum was placed. A peripheral clear corneal incision was made at the surgical limbus centered at the 11:00 meridian. A separate clear corneal stab incision was made with a 15 degree blade at the 2:00 meridian to permit bi-manual technique. Provisc was instilled into the anterior chamber through that incision.  A keratome was used to create a self sealing incision entering the anterior chamber at the 11:00 meridian. A capsulorhexis was performed using a bent 25g needle. The lens was hydrodissected and the nucleus was hydrodilineated using a Nichammin cannula. The Chang chopper was inserted and used to rotate the lens to insure adequate lens mobility. The phacoemulsification handpiece was inserted and a combined phaco-chop technique was employed, fracturing the lens into separate sections with subsequent removal with the phaco handpiece.   The I/A cannula was used to remove remaining lens cortex. Provisc was instilled and used to deepen the anterior chamber and posterior capsule bag. The Monarch injector was used to place a folded Acrysof MA50BM PC IOL, + 19.50  diopters, into the capsule bag. A Sinskey lens hook was used to dial in the trailing haptic.  The I/A cannula was used to remove the viscoelastic from the anterior chamber. BSS was used to bring IOP to the desired range and the wound was checked to insure it was watertight. Subconjunctival injections of Ance 100/0.67ml and Dexamethasone 4mg /50ml were placed without complication. The lid speculum and drapes were removed and the patient's eye was patched with Polymixin/Bacitracin ophthalmic ointment. An eye  shield was placed and the patient was transferred alert and conversant from the operating room to the post-operative recovery area.   Shade Flood, MD

## 2011-08-12 NOTE — Anesthesia Postprocedure Evaluation (Signed)
  Anesthesia Post-op Note  Patient: Roberta White  Procedure(s) Performed: Procedure(s) (LRB): CATARACT EXTRACTION PHACO AND INTRAOCULAR LENS PLACEMENT (IOC) (Left)  Patient Location: PACU  Anesthesia Type: MAC  Level of Consciousness: awake  Airway and Oxygen Therapy: Patient Spontanous Breathing  Post-op Pain: mild  Post-op Assessment: Post-op Vital signs reviewed  Post-op Vital Signs: Reviewed  Complications: No apparent anesthesia complications

## 2011-08-12 NOTE — Anesthesia Procedure Notes (Signed)
Procedure Name: Intubation Date/Time: 08/12/2011 3:15 PM Performed by: Molli Hazard Pre-anesthesia Checklist: Patient identified, Emergency Drugs available, Suction available and Patient being monitored Patient Re-evaluated:Patient Re-evaluated prior to inductionOxygen Delivery Method: Circle system utilized Preoxygenation: Pre-oxygenation with 100% oxygen Intubation Type: IV induction Ventilation: Mask ventilation without difficulty Laryngoscope Size: Miller and 2 Grade View: Grade I Tube type: Oral Tube size: 7.5 mm Number of attempts: 1 Airway Equipment and Method: Stylet Placement Confirmation: ETT inserted through vocal cords under direct vision,  positive ETCO2 and breath sounds checked- equal and bilateral Secured at: 22 cm Tube secured with: Tape Dental Injury: Teeth and Oropharynx as per pre-operative assessment

## 2011-08-12 NOTE — Interval H&P Note (Signed)
History and Physical Interval Note:  08/12/2011 2:25 PM  Roberta White  has presented today for surgery, with the diagnosis of Cataract   The various methods of treatment have been discussed with the patient and family. After consideration of risks, benefits and other options for treatment, the patient has consented to  Procedure(s) (LRB): CATARACT EXTRACTION PHACO AND INTRAOCULAR LENS PLACEMENT (IOC) (Left) as a surgical intervention .  The patient's history has been reviewed, patient examined, no change in status, stable for surgery.  I have reviewed the patient's chart and labs.  Questions were answered to the patient's satisfaction.     Shade Flood, MD

## 2011-08-13 ENCOUNTER — Encounter (HOSPITAL_COMMUNITY): Payer: Self-pay | Admitting: Ophthalmology

## 2012-05-31 ENCOUNTER — Encounter (HOSPITAL_COMMUNITY): Payer: Self-pay | Admitting: Emergency Medicine

## 2012-05-31 ENCOUNTER — Emergency Department (HOSPITAL_COMMUNITY)
Admission: EM | Admit: 2012-05-31 | Discharge: 2012-05-31 | Disposition: A | Discharge status: Home / Self Care | Payer: Medicare Other | Attending: Emergency Medicine | Admitting: Emergency Medicine

## 2012-05-31 DIAGNOSIS — Z79899 Other long term (current) drug therapy: Secondary | ICD-10-CM | POA: Insufficient documentation

## 2012-05-31 DIAGNOSIS — Z86718 Personal history of other venous thrombosis and embolism: Secondary | ICD-10-CM | POA: Insufficient documentation

## 2012-05-31 DIAGNOSIS — Z86711 Personal history of pulmonary embolism: Secondary | ICD-10-CM | POA: Insufficient documentation

## 2012-05-31 DIAGNOSIS — F29 Unspecified psychosis not due to a substance or known physiological condition: Secondary | ICD-10-CM | POA: Insufficient documentation

## 2012-05-31 DIAGNOSIS — N390 Urinary tract infection, site not specified: Secondary | ICD-10-CM

## 2012-05-31 DIAGNOSIS — F0390 Unspecified dementia without behavioral disturbance: Secondary | ICD-10-CM

## 2012-05-31 LAB — URINE MICROSCOPIC-ADD ON

## 2012-05-31 LAB — BASIC METABOLIC PANEL
CO2: 28 mEq/L (ref 19–32)
Calcium: 9.9 mg/dL (ref 8.4–10.5)
GFR calc non Af Amer: 57 mL/min — ABNORMAL LOW (ref >90)
Potassium: 3.5 mEq/L (ref 3.5–5.1)
Sodium: 141 mEq/L (ref 135–145)

## 2012-05-31 LAB — CBC WITH DIFFERENTIAL/PLATELET
Basophils Absolute: 0 10*3/uL (ref 0.0–0.1)
Eosinophils Absolute: 0.1 10*3/uL (ref 0.0–0.7)
Eosinophils Relative: 1 % (ref 0–5)
Lymphocytes Relative: 39 % (ref 12–46)
MCV: 85.7 fL (ref 78.0–100.0)
Neutrophils Relative %: 49 % (ref 43–77)
Platelets: 221 10*3/uL (ref 150–400)
RDW: 13.7 % (ref 11.5–15.5)
WBC: 6.2 10*3/uL (ref 4.0–10.5)

## 2012-05-31 LAB — URINALYSIS, ROUTINE W REFLEX MICROSCOPIC
Protein, ur: NEGATIVE mg/dL
Urobilinogen, UA: 0.2 mg/dL (ref 0.0–1.0)

## 2012-05-31 MED ORDER — CEPHALEXIN 250 MG PO CAPS: 250.0000 mg | ORAL_CAPSULE | Freq: Four times a day (QID) | ORAL | Status: DC

## 2012-05-31 MED ORDER — CEPHALEXIN 250 MG PO CAPS
250.0000 mg | ORAL_CAPSULE | Freq: Once | ORAL | Status: AC
  Administered 2012-05-31: 250 mg via ORAL
  Filled 2012-05-31: qty 1
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
## 2012-05-31 NOTE — ED Notes (Signed)
Social Work is not available and will not be in to see patient until after 10am.

## 2012-05-31 NOTE — ED Notes (Signed)
Pt's daughter states that the pt has been "running away from home".  States that she has been "fussing" a lot.  Has a known eye condition that she is being treated for.  Pt is only oriented to self.  Does not know birthday, day of the week, month of the year, location, president, etc.
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
## 2012-05-31 NOTE — ED Notes (Signed)
ZOX:[id number]<AV> Expected date:<BR> Expected time:<BR> Means of arrival:<BR> Comments:<BR> Hold for triage 3

## 2012-06-02 LAB — URINE CULTURE

## 2012-06-03 ENCOUNTER — Telehealth (HOSPITAL_COMMUNITY): Payer: Self-pay | Admitting: Emergency Medicine

## 2012-06-03 NOTE — ED Notes (Signed)
Post ED Visit - Positive Culture Follow-up  Culture report reviewed by antimicrobial stewardship pharmacist: []  Wes Dulaney, Pharm.D., BCPS [x]  Celedonio Miyamoto, Pharm.D., BCPS []  Georgina Pillion, [address].D., BCPS []  [place], [address].D., BCPS, AAHIVP []  Estella Husk, Pharm.D., BCPS, AAHIV  Positive urine culture Treated with Keflex, organism sensitive to the same and no further patient follow-up is required at this time.  Kylie A Holland 06/03/2012, 5:51 PM

## 2013-03-11 ENCOUNTER — Emergency Department (HOSPITAL_COMMUNITY)
Admission: EM | Admit: 2013-03-11 | Discharge: 2013-03-11 | Disposition: A | Discharge status: Home / Self Care | Payer: Medicare Other | Source: Home / Self Care | Attending: Emergency Medicine | Admitting: Emergency Medicine

## 2013-03-11 ENCOUNTER — Encounter (HOSPITAL_COMMUNITY): Payer: Self-pay | Admitting: Emergency Medicine

## 2013-03-11 DIAGNOSIS — I739 Peripheral vascular disease, unspecified: Secondary | ICD-10-CM

## 2013-03-11 DIAGNOSIS — M21611 Bunion of right foot: Secondary | ICD-10-CM

## 2013-03-11 DIAGNOSIS — L600 Ingrowing nail: Secondary | ICD-10-CM

## 2013-03-11 DIAGNOSIS — M21612 Bunion of left foot: Secondary | ICD-10-CM

## 2013-03-11 DIAGNOSIS — L602 Onychogryphosis: Secondary | ICD-10-CM

## 2013-03-11 MED ORDER — HYDROCODONE-ACETAMINOPHEN 5-325 MG PO TABS: 1.0000 | ORAL_TABLET | Freq: Four times a day (QID) | ORAL | Status: DC | PRN

## 2013-03-11 NOTE — ED Notes (Signed)
C/o bil foot pain x 1 week and swelling for 2 days.

## 2013-03-11 NOTE — Discharge Instructions (Signed)
You need to be evaluated by a vascular doctor for the diminished circulation in your legs and feet. You also need to be evaluated by a foot doctor (Podiatrist) to have your bunions treated and your nails trimmed. Until these visits follow instructions below to help with discomfort and take the medication as directed for pain.    Bunion You have a bunion deformity of the feet. This is more common in women. It tends to be an inherited problem. Symptoms can include pain, swelling, and deformity around the great toe. Numbness and tingling may also be present. Your symptoms are often worsened by wearing shoes that cause pressure on the bunion. Changing the type of shoes you wear helps reduce symptoms. A wide shoe decreases pressure on the bunion. An arch support may be used if you have flat feet. Avoid shoes with heels higher than two inches. This puts more pressure on the bunion. X-rays may be helpful in evaluating the severity of the problem. Other foot problems often seen with bunions include corns, calluses, and hammer toes. If the deformity or pain is severe, surgical treatment may be necessary. Keep off your painful foot as much as possible until the pain is relieved. Call your caregiver if your symptoms are worse.  SEEK IMMEDIATE MEDICAL CARE IF:  You have increased redness, pain, swelling, or other symptoms of infection. Document Released: 12/29/2004 Document Revised: 03/23/2011 Document Reviewed: 06/28/2006 [facility] Patient Information 2014 [place].  Bunion Care A bunion is a boney protrusion at the base of your big toe (metatarsal-phalangeal joint). This problem, if painful or troublesome can be corrected with surgery. This is an elective surgery, so you can pick a convenient time for the procedure. The surgery may:  Improve appearance (cosmetic).  Relieve pain.  Improve function. Your foot is made up of a complex set of twenty-six bones which are held together by tough fibrous  ligaments. The movement of the foot is controlled by muscles in the foot and leg. These muscles attach to the foot by cord like structures (tendons) that attach muscle to bone. If surgery is recommended, your caregiver will explain your foot problem and how surgery can improve it. Your caregiver can answer questions you may have about the potential risks and complications involved. After determining that foot surgery is necessary to correct your problem, you can proceed with plans for the surgery. LET YOUR CAREGIVER KNOW ABOUT:   Previous problems with anesthetics or medicines used to numb the skin.  Allergies to dyes, iodine, foods, and/or latex.  Medicines taken including herbs, eye drops, prescription medicines (especially medicines used to "thin the blood"), aspirin and other over-the-counter medicines, and steroids (by mouth or as a cream).  History of bleeding or blood problems.  Possibility of pregnancy, if this applies.  History of blood clots in your legs and/or lungs.  Previous surgery.  Other important health problems. Let your caregiver know about health changes prior to surgery. BEFORE THE PROCEDURE  You should be present 60 minutes prior to your procedure or as directed.  PROCEDURE BUNION TYPES AND THEIR TREATMENTS  Positional Bunion. A positional bunion develops when a bony growth on the side of the metatarsal bone enlarges the joint. The metatarsal forces the joint capsule to stretch over it. As this growth pushes the big toe toward the others, the tendons on the inside tighten. This forces the big toe farther out of alignment. The bunion presses against the shoe, irritating the skin and causes further pain and disability.  Positional Bunionectomy Treatment. The bunion is removed. Tight tendons may be released.  Follow-up Care. Your toe is apt to be stiff at first but will loosen up as you move it. You may need to wear a special surgical shoe and, possibly, a splint for  about three weeks.  Mild Structural Bunion. Structural bunions occur when the angle between the first and second metatarsal bones increases to a point where it is greater than normal. The increased angle of the metatarsals makes the big toe slant toward the other toes. Sometimes bony growths may form. Irritation and swelling often follow.  Structural Bunionectomy Treatment. Your caregiver surgically repositions the bone by decreasing the angle and may use a fixation device to hold it together. The bunion (bump) is also removed.  Degenerative Joint Disease (Arthritis). When wear-and-tear arthritis (osteoarthritis) of aging affects the big toe joint, pain and reduced joint motion may result. This is not a true bunion but may be associated with bunions. Left untreated, it can increase wear and tear in the joint and break down the cartilage. Pain and stiffness are problems of both wear-and-tear arthritis and rheumatoid arthritis.  Arthroplasty With Joint Implantation As Treatment. The bunion is first removed; then the degenerated joint is removed and replaced with an implant. AFTER THE PROCEDURE  After surgery your bone will heal in phases. A callous (new bone formation) forms, bridging the damaged bone allowing it to heal. In about 6 months, the bone is back to normal strength along with a return of nearly normal function. It is best to do elective surgeries when your health is optimal.  After surgery, you will be taken to the recovery area where a nurse will watch and check your progress. Once you are awake, stable, and taking fluids well, barring other problems you will be allowed to go home. HOME CARE INSTRUCTIONS  Be sure to ask your caregiver how long you will be off your feet and home from work. Plan accordingly. There are several types of bunions and varying surgical treatments for each. Common types are explained above. Your surgery may be similar and may include a fixation device (such as a small  screw). Your foot and ankle may be immobilized by a cast (from your toes to below your knee). You may be asked not to bear weight on this foot for a few weeks or until comfortable. Once home, an ice pack applied to your operative site may help with discomfort and keep the swelling down. You may be able to walk a day or two after surgery. Your podiatrist may prescribe a splint or a special shoe to be worn for several weeks. Only take over-the-counter or prescription medicines for pain, discomfort, or fever as directed by your caregiver.  SEEK MEDICAL CARE IF:   There is increased bleeding (more than a small spot) from the surgical site.  You notice redness, swelling, or increasing pain in the surgical site.  Pus is coming from the site.  An unexplained oral temperature above 102 F (38.9 C) develops.  You notice a foul smell coming from the surgical site or dressing. SEEK IMMEDIATE MEDICAL CARE IF:  You develop a rash, have difficulty breathing, or have any allergic problems with medications. Document Released: 12/27/1999 Document Revised: 03/23/2011 Document Reviewed: 01/02/2008 [facility] Patient Information 2014 [place].  Ingrown Toenail An ingrown toenail occurs when the sharp edge of a toenail grows into the skin of the groove beside the nail (lateral nail groove), causing pain. Ingrown toenails most  commonly occur on the first (big) toe. If left untreated, an ingrown toenail may become inflamed and infected. The infection can even spread throughout the toe. SYMPTOMS   Pain, centered on the nail groove.  Tenderness and inflammation.  Pus drainage (occasionally).  Signs of infection: redness, swelling, pain, warm to the touch. CAUSES  Ingrown toenails are caused when the toenail grows into the neighboring fleshy nail fold. This may be the result of poor nail trimming or pressure from shoes.  RISK INCREASES WITH:   Curved nail formations.  Clipping toenails too far on the  sides, which allows tissue to grow over the nail.  Poorly fitted shoes, especially those that press down on the toenails.  Sports that require sudden stops, causing the toe to jam into the shoe. PREVENTION   Trim toenails properly, making sure the sides are not clipped too short.  Wear properly fitted shoes.  Avoid excessive pressure on the toenails. PROGNOSIS  Ingrown toenails are usually curable within 1 week, depending on the presence or absence of an infection.  RELATED COMPLICATIONS   Chronic infection, that cannot be cured without surgery.  Spread of infection throughout the toe, or even the bone. TREATMENT  Treatment first involves resting from aggravating activities, wearing shoes that do not place pressure on the toenails, antibiotics (if infected), and soaking the foot in warm water (with or without Epsom salt). After the foot has been soaked, you may be able to gently lift the nail from the fleshy tissue, and place a bit of cotton ball under the nail, easing the pressure. If you have been prescribed antibiotics, expect relief to begin up to 48 hours after taking the medicine. Symptoms usually go away within 1 week. For people who suffer from persistent (chronic) ingrown toenails, surgery may be advised. MEDICATION   If pain medicine is needed, nonsteroidal anti-inflammatory medicines (aspirin and ibuprofen), or other minor pain relievers (acetaminophen), are often advised.  Do not take pain medicine for 7 days before surgery.  Stronger pain relievers may be prescribed, if your caregiver thinks they are needed. Use only as directed and only as much as you need.  Antibiotics may be prescribed to fight infection. Take as directed by your caregiver. Finish the entire prescription, even if you start to feel better before you finish it. SOAKS AND COLD THERAPY   Soak the toe (or whole foot) for 20 minutes, twice a day, in a gallon of warm water. You may add 2 tablespoons of Epsom  salts or a mild detergent.  Cold treatment (icing) should be applied for 10 to 15 minutes every 2 to 3 hours for inflammation and pain, and immediately after activity that aggravates your symptoms. Use ice packs or an ice massage. SEEK MEDICAL CARE IF:   Symptoms get worse or do not improve in 48 hours, despite treatment.  You develop fever, increased pain and swelling, or signs of infection (pain, redness, tenderness, swelling, warmth) in the toe, after surgery.  New, unexplained symptoms develop. (Drugs used in treatment may produce side effects.) Document Released: 12/29/2004 Document Revised: 03/23/2011 Document Reviewed: 04/12/2008 [facility] Patient Information 2014 [place], [place].  Peripheral Vascular Disease  Peripheral vascular disease (PVD) is caused by cholesterol buildup in the arteries. The arteries become narrow or clogged. This makes it hard for blood to flow. It happens most in the legs, but it can occur in other areas of your body. HOME CARE   Quit smoking, if you smoke.  Exercise as told by your  doctor.  Follow a low-fat, low-cholesterol diet as told by your doctor.  Control your diabetes, if you have diabetes.  Care for your feet to prevent infection.  Only take medicine as told by your doctor. GET HELP RIGHT AWAY IF:   You have pain or lose feeling (numbness) in your arms or legs.  Your arms or legs turn cold or blue.  You have redness, warmth, and puffiness (swelling) in your arms or legs. MAKE SURE YOU:   Understand these instructions.  Will watch your condition.  Will get help right away if you are not doing well or get worse. Document Released: 03/25/2009 Document Revised: 03/23/2011 Document Reviewed: 03/25/2009 [facility] Patient Information 2014 [place], [place].

## 2013-03-11 NOTE — ED Provider Notes (Signed)
CSN: [id number]     Arrival date & time 03/11/13  1219 History   First MD Initiated Contact with Patient 03/11/13 1550     Chief Complaint  Patient presents with  . Foot Swelling    Patient is a 80 y.o. female presenting with lower extremity pain. The history is provided by the patient and a relative.  Foot Pain This is a new problem. The current episode started more than 2 days ago. The problem has been gradually worsening. Pertinent negatives include no chest pain, no abdominal pain, no headaches and no shortness of breath. The symptoms are aggravated by walking and standing. Nothing relieves the symptoms.  Pt and daughter report 1 week h/o increasing bil foot pain and swelling.   Past Medical History  Diagnosis Date  . DVT (deep venous thrombosis)   . Pulmonary embolism   . Dementia    Past Surgical History  Procedure Laterality Date  . Cataract extraction w/phaco  08/12/2011    Procedure: CATARACT EXTRACTION PHACO AND INTRAOCULAR LENS PLACEMENT (IOC);  Surgeon: Adonis Brook, MD;  Location: [facility];  Service: Ophthalmology;  Laterality: Left;   History reviewed. No pertinent family history. History  Substance Use Topics  . Smoking status: Never Smoker   . Smokeless tobacco: Not on file  . Alcohol Use: No   OB History   Grav Para Term Preterm Abortions TAB SAB Ect Mult Living                 Review of Systems  Constitutional: Negative.  Negative for fever, chills and fatigue.  HENT: Negative.   Eyes: Negative.   Respiratory: Negative.  Negative for cough and shortness of breath.   Cardiovascular: Negative for chest pain and leg swelling.  Gastrointestinal: Negative.  Negative for abdominal pain.  Endocrine: Negative.   Genitourinary: Negative.   Musculoskeletal: Negative.   Skin: Negative.   Allergic/Immunologic: Negative.   Neurological: Negative.  Negative for headaches.  Hematological: Negative.   Psychiatric/Behavioral: Negative.     Allergies  Review of  patient's allergies indicates no known allergies.  Home Medications   Current Outpatient Rx  Name  Route  Sig  Dispense  Refill  . cephALEXin (KEFLEX) 250 MG capsule   Oral   Take 1 capsule (250 mg total) by mouth 4 (four) times daily.   28 capsule   0   . HYDROcodone-acetaminophen (NORCO/VICODIN) 5-325 MG per tablet   Oral   Take 1 tablet by mouth every 6 (six) hours as needed for severe pain.   10 tablet   0   . vitamin C (ASCORBIC ACID) 500 MG tablet   Oral   Take 500 mg by mouth 2 (two) times daily.           BP 143/84  Pulse 73  Temp(Src) 97.5 F (36.4 C) (Oral)  Resp 18 Physical Exam  Constitutional: She is oriented to person, place, and time. She appears well-developed and well-nourished.  HENT:  Head: Normocephalic and atraumatic.  Eyes: Conjunctivae are normal.  Cardiovascular: Normal rate.   Pulmonary/Chest: Effort normal.  Musculoskeletal:  Bil feet very cold to touch. Able to doppler DP pulse to (R) foot but no PT. Able to doppler PT pulse to (L) foot but no DP pulse. Both feet noted w/ large bunions to medial aspects and smaller bunions to lateral aspects as well callous/corn lesions in between several digits. Significant overgrowth of toe nails with early ingrown toe nails at several sites. No overt infectious process.  Unable to appreciate significant swelling to either foot or LE. No redness, swelling to TTP to either LE.  Neurological: She is alert and oriented to person, place, and time.  Skin: Skin is warm and dry.  Psychiatric: She has a normal mood and affect.    ED Course  Procedures (including critical care time) Labs Review Labs Reviewed - No data to display Imaging Review No results found.   MDM   1. PAD (peripheral artery disease)   2. Bilateral bunions   3. Nail overgrowth   4. Nail ingrowing    1 Week h/o reported swelling and pain to bil feet. Exam suggest some degree of PAD with multiple bunions, corns and likely early ingrown  nails at several sites. No acute infectious process. Able to doppler pulses to bil feet but clearly arterial circulation somewhat diminished (likely chronic) no hair on BLE's.etc.Discussed at length with daughter that pt needs f/u w/ vascular MD for further assessment of PAD. Also strongly encouraged daughter to arrange f/u w/ podiatrists to address, bunions, corns and significant overgrowth of toenails. I am unable to appreciate any edema to BLE's or feet. Will prescribe a short course of med for pain but encourage daughter only give at night. Encouraged her to help pt soak her feet to keep nails soft and less likely to be injurious. Discussed importance of pt wearing loose fitting shoes (tennis shoes) until these issues can be addressed. Daughter verbalizes understanding and is in agreement with plan.    Jeryl Columbia, NP 03/13/13 2106

## 2013-03-13 NOTE — ED Provider Notes (Signed)
Medical screening examination/treatment/procedure(s) were performed by non-physician practitioner and as supervising physician I was immediately available for consultation/collaboration.  Philipp Deputy, M.D.  Harden Mo, MD 03/13/13 [phone]

## 2013-12-16 ENCOUNTER — Encounter (HOSPITAL_COMMUNITY): Payer: Self-pay | Admitting: *Deleted

## 2013-12-16 ENCOUNTER — Observation Stay (HOSPITAL_COMMUNITY)
Admission: EM | Admit: 2013-12-16 | Discharge: 2013-12-18 | Disposition: A | Discharge status: Home / Self Care | Payer: Medicare Other | Attending: Cardiology | Admitting: Cardiology

## 2013-12-16 ENCOUNTER — Other Ambulatory Visit: Payer: Self-pay

## 2013-12-16 ENCOUNTER — Emergency Department (HOSPITAL_COMMUNITY): Payer: Medicare Other

## 2013-12-16 DIAGNOSIS — R001 Bradycardia, unspecified: Secondary | ICD-10-CM | POA: Diagnosis not present

## 2013-12-16 DIAGNOSIS — E119 Type 2 diabetes mellitus without complications: Secondary | ICD-10-CM | POA: Diagnosis not present

## 2013-12-16 DIAGNOSIS — I480 Paroxysmal atrial fibrillation: Secondary | ICD-10-CM | POA: Insufficient documentation

## 2013-12-16 DIAGNOSIS — R54 Age-related physical debility: Secondary | ICD-10-CM | POA: Insufficient documentation

## 2013-12-16 DIAGNOSIS — I441 Atrioventricular block, second degree: Secondary | ICD-10-CM | POA: Diagnosis not present

## 2013-12-16 DIAGNOSIS — F0390 Unspecified dementia without behavioral disturbance: Secondary | ICD-10-CM | POA: Insufficient documentation

## 2013-12-16 DIAGNOSIS — Z86711 Personal history of pulmonary embolism: Secondary | ICD-10-CM | POA: Diagnosis not present

## 2013-12-16 DIAGNOSIS — Z66 Do not resuscitate: Secondary | ICD-10-CM | POA: Insufficient documentation

## 2013-12-16 DIAGNOSIS — R079 Chest pain, unspecified: Secondary | ICD-10-CM | POA: Diagnosis not present

## 2013-12-16 DIAGNOSIS — I499 Cardiac arrhythmia, unspecified: Secondary | ICD-10-CM | POA: Diagnosis present

## 2013-12-16 DIAGNOSIS — I495 Sick sinus syndrome: Secondary | ICD-10-CM | POA: Insufficient documentation

## 2013-12-16 DIAGNOSIS — Z86718 Personal history of other venous thrombosis and embolism: Secondary | ICD-10-CM | POA: Insufficient documentation

## 2013-12-16 HISTORY — DX: Type 2 diabetes mellitus without complications: E11.9

## 2013-12-16 LAB — CBC
HEMATOCRIT: 36.5 % (ref 36.0–46.0)
HEMOGLOBIN: 12.3 g/dL (ref 12.0–15.0)
MCH: 27.9 pg (ref 26.0–34.0)
MCHC: 33.7 g/dL (ref 30.0–36.0)
MCV: 82.8 fL (ref 78.0–100.0)
Platelets: 257 10*3/uL (ref 150–400)
RBC: 4.41 MIL/uL (ref 3.87–5.11)
RDW: 14.8 % (ref 11.5–15.5)
WBC: 5.8 10*3/uL (ref 4.0–10.5)

## 2013-12-16 LAB — BASIC METABOLIC PANEL
Anion gap: 12 (ref 5–15)
BUN: 9 mg/dL (ref 6–23)
CALCIUM: 9.3 mg/dL (ref 8.4–10.5)
CO2: 26 meq/L (ref 19–32)
Chloride: 100 mEq/L (ref 96–112)
Creatinine, Ser: 0.73 mg/dL (ref 0.50–1.10)
GFR calc Af Amer: 82 mL/min — ABNORMAL LOW (ref >90)
GFR, EST NON AFRICAN AMERICAN: 71 mL/min — AB (ref >90)
GLUCOSE: 124 mg/dL — AB (ref 70–99)
Potassium: 4.6 mEq/L (ref 3.7–5.3)
Sodium: 138 mEq/L (ref 137–147)

## 2013-12-16 LAB — PRO B NATRIURETIC PEPTIDE: PRO B NATRI PEPTIDE: 781.1 pg/mL — AB (ref 0–450)

## 2013-12-16 LAB — TROPONIN I: Troponin I: <0.3 ng/mL (ref <0.30)

## 2013-12-16 NOTE — ED Notes (Signed)
Pt denies any chest pain at this time.  St's she had pain last pm but none today.  Daughter st's pt was c/o chest pain prior to coming to ED

## 2013-12-16 NOTE — ED Provider Notes (Signed)
CSN: [id number]     Arrival date & time 12/16/13  1658 History   First MD Initiated Contact with Patient 12/16/13 1831     Chief Complaint  Patient presents with  . Chest Pain     (Consider location/radiation/quality/duration/timing/severity/associated sxs/prior Treatment) HPI Comments: Patient is a 80 yo F PMHx significant for DVT, PE, Dementia, DM presenting to the ED with her daughter for evaluation of her chest pain. The chest pain began last evening and has been intermittent. The patient states she has been chest pain free since last evening, but the daughter states the patient had been complaining of her chest hurting since just prior to arriving to the ED. The patient endorses she has had a cough. No history of cardiac catheterizations or stress tests. Last echocardiogram was 3 years ago. Patient is a level V caveat d/t dementia.   Patient is a 80 y.o. female presenting with chest pain.  Chest Pain   Past Medical History  Diagnosis Date  . DVT (deep venous thrombosis)   . Pulmonary embolism   . Dementia   . Diabetes mellitus without complication   . Dementia    Past Surgical History  Procedure Laterality Date  . Cataract extraction w/phaco  08/12/2011    Procedure: CATARACT EXTRACTION PHACO AND INTRAOCULAR LENS PLACEMENT (IOC);  Surgeon: Adonis Brook, MD;  Location: [facility];  Service: Ophthalmology;  Laterality: Left;   No family history on file. History  Substance Use Topics  . Smoking status: Never Smoker   . Smokeless tobacco: Not on file  . Alcohol Use: No   OB History    No data available     Review of Systems  Unable to perform ROS: Dementia  Cardiovascular: Positive for chest pain.      Allergies  Review of patient's allergies indicates no known allergies.  Home Medications   Prior to Admission medications   Medication Sig Start Date End Date Taking? Authorizing Provider  HYDROcodone-acetaminophen (NORCO/VICODIN) 5-325 MG per tablet Take 1 tablet by  mouth every 6 (six) hours as needed for severe pain. 03/11/13   Rhetta Mura Schorr, NP   BP 135/82 mmHg  Pulse 40  Temp(Src) 98.4 F (36.9 C)  Resp 25  SpO2 96% Physical Exam  Constitutional: She is oriented to person, place, and time. She appears well-developed and well-nourished. No distress.  HENT:  Head: Normocephalic and atraumatic.  Right Ear: External ear normal.  Left Ear: External ear normal.  Nose: Nose normal.  Mouth/Throat: Oropharynx is clear and moist.  Eyes: Conjunctivae are normal.  Bilateral ectropion.   Neck: Normal range of motion. Neck supple.  Cardiovascular: Normal rate, regular rhythm, normal heart sounds and intact distal pulses.   Pulmonary/Chest: Effort normal and breath sounds normal. No respiratory distress.  Abdominal: Soft.  Musculoskeletal: Normal range of motion. She exhibits no edema.  Neurological: She is alert and oriented to person, place, and time.  Skin: Skin is warm and dry. She is not diaphoretic.  Psychiatric: She has a normal mood and affect.  Nursing note and vitals reviewed.   ED Course  Procedures (including critical care time) Medications - No data to display  Labs Review Labs Reviewed  BASIC METABOLIC PANEL - Abnormal; Notable for the following:    Glucose, Bld 124 (*)    GFR calc non Af Amer 71 (*)    GFR calc Af Amer 82 (*)    All other components within normal limits  PRO B NATRIURETIC PEPTIDE - Abnormal; Notable  for the following:    Pro B Natriuretic peptide (BNP) 781.1 (*)    All other components within normal limits  CBC  TROPONIN I  TROPONIN I    Imaging Review Dg Chest 2 View  12/16/2013   CLINICAL DATA:  Dementia.  Patient holding chest and not speaking.  EXAM: CHEST  2 VIEW  COMPARISON:  Chest radiograph 08/12/2011  FINDINGS: Stable enlarged cardiac and mediastinal contours. Stable minimal heterogeneous opacities bilateral lung bases. Consolidative pulmonary opacities. No pleural effusion or pneumothorax. Mid  thoracic spine degenerative change.  IMPRESSION: No acute cardiopulmonary process.  Minimal opacities bilateral lung bases may represent scarring and or atelectasis.   Electronically Signed   By: Lovey Newcomer M.D.   On: 12/16/2013 18:38     EKG Interpretation   Date/Time:  Saturday December 16 2013 17:05:30 EST Ventricular Rate:  91 PR Interval:    QRS Duration: 56 QT Interval:  392 QTC Calculation: 482 R Axis:   70 Text Interpretation:  Accelerated Junctional rhythm Abnormal ECG Since  last tracing rhtym has changed from nsr Confirmed by MILLER  MD, [place]  [phone]) on 12/16/2013 10:49:35 PM      Discussed patient case with Dr. Murvin Natal of cardiology  MDM   Final diagnoses:  Chest pain, unspecified chest pain type    Filed Vitals:   12/16/13 2045  BP: 135/82  Pulse:   Temp:   Resp: 25   Afebrile, NAD, non-toxic appearing, AAOx4.   Patient with history of chest pain with accelerated junctional rhythm noted on EKG, this is new from previous EKGs in the ED. Cardiology has been consulted and will see patient in the ED for likely admit for evaluation of chest pain and arrhythmia. Delta troponins negative. CXR unremarkable. This case was discussed with Dr. Sabra Heck who has seen the patient and agrees with plan to admit.      Harlow Mares, PA-C 12/16/13 2337  Johnna Acosta, MD 12/16/13 [phone]

## 2013-12-16 NOTE — ED Provider Notes (Signed)
Elderly intermittent CP, no other c/o - pain at this time, mid sternal - dementia, no other descriptions - prior ECG showed a PR variability - possible block - today has accelerated junctional - neesd repeat, labs unremarkable, hx of no heart cath - prior hx of PE - not on anticoag.  Clear lungs, no edema, no jvd, slight arrhytymia, bradycardia in mid 50's.  D/w Cardiologist - will admit for underlying arrhythmia and CP  No decomensation while in ED  ED ECG REPORT  I personally interpreted this EKG   Date: 12/16/2013   Rate: 67  Rhythm: normal sinus rhythm  QRS Axis: normal  Intervals: PR prolonged  ST/T Wave abnormalities: normal  Conduction Disutrbances:second-degree A-V block, ( Mobitz I )  Narrative Interpretation:   Old EKG Reviewed: changes noted  Medical screening examination/treatment/procedure(s) were conducted as a shared visit with non-physician practitioner(s) and myself.  I personally evaluated the patient during the encounter.  Clinical Impression:   Final diagnoses:  Chest pain, unspecified chest pain type         Johnna Acosta, MD 12/16/13 2340

## 2013-12-16 NOTE — H&P (Signed)
Admit date: 12/16/2013 Primary Physician  Default, Provider, MD Primary Cardiologist  N/A  CC: chest pain  HPI: [name] is a 80 y.o african [place] female with reported history of dementia who presents with chest pain.   Pt lives at home with her daughter. HPI is obtained from daughter. Pt complained of chest pain yesterday as daughter was taking her to the bathroom. Pain was in anterior chest and resolved after five minutes. No associated dyspnea, nausea, diaphoresis. Pt again had a similar episode today as she was being talked to take shower. Similar to the day before without any associated symptoms. At this time, daughter decided to bring patient to the emergency department. Pt has no history of chest pain. ROS negative for fever, chills, nausea, vomiting, abd pain, LE edema, PND, DOE. Pt denies palpitations. No complaints of fatigue. However, daughter does feel that patient has been sleeping more than usual lately.    PMH:   Past Medical History  Diagnosis Date  . DVT (deep venous thrombosis)   . Pulmonary embolism   . Dementia   . Diabetes mellitus without complication   . Dementia     PSH:   Past Surgical History  Procedure Laterality Date  . Cataract extraction w/phaco  08/12/2011    Procedure: CATARACT EXTRACTION PHACO AND INTRAOCULAR LENS PLACEMENT (IOC);  Surgeon: Adonis Brook, MD;  Location: [facility];  Service: Ophthalmology;  Laterality: Left;   Allergies:  Review of patient's allergies indicates no known allergies. Prior to Admit Meds:   Prior to Admission medications   Medication Sig Start Date End Date Taking? Authorizing Provider  HYDROcodone-acetaminophen (NORCO/VICODIN) 5-325 MG per tablet Take 1 tablet by mouth every 6 (six) hours as needed for severe pain. 03/11/13   Jeryl Columbia, NP   Fam HX:   No family history on file. Social HX:    History   Social History  . Marital Status: Single    Spouse Name: N/A    Number of Children: N/A  . Years of  Education: N/A   Occupational History  . Not on file.   Social History Main Topics  . Smoking status: Never Smoker   . Smokeless tobacco: Not on file  . Alcohol Use: No  . Drug Use: No  . Sexual Activity: Not on file   Other Topics Concern  . Not on file   Social History Narrative     ROS:  All 11 ROS were addressed and are negative except what is stated in the HPI   Gen - pt is alert, awake. Not oriented to place or time.  Frail, elderly, pleasant female Neck - flat JVP Poor dentition Oropharynx moist CV - s1s2 heard Lungs - ctab Abd - +bs, nd, nt Ext - no edema      Labs:   Lab Results  Component Value Date   WBC 5.8 12/16/2013   HGB 12.3 12/16/2013   HCT 36.5 12/16/2013   MCV 82.8 12/16/2013   PLT 257 12/16/2013    Recent Labs Lab 12/16/13 1705  NA 138  K 4.6  CL 100  CO2 26  BUN 9  CREATININE 0.73  CALCIUM 9.3  GLUCOSE 124*    Recent Labs  12/16/13 1705 12/16/13 2005  TROPONINI <0.30 <0.30   No results found for: CHOL, HDL, LDLCALC, TRIG No results found for: DDIMER   Radiology:  Dg Chest 2 View  12/16/2013   CLINICAL DATA:  Dementia.  Patient holding chest and not speaking.  EXAM: CHEST  2 VIEW  COMPARISON:  Chest radiograph 08/12/2011  FINDINGS: Stable enlarged cardiac and mediastinal contours. Stable minimal heterogeneous opacities bilateral lung bases. Consolidative pulmonary opacities. No pleural effusion or pneumothorax. Mid thoracic spine degenerative change.  IMPRESSION: No acute cardiopulmonary process.  Minimal opacities bilateral lung bases may represent scarring and or atelectasis.   Electronically Signed   By: Lovey Newcomer M.D.   On: 12/16/2013 18:38   Personally viewed.   EKG:  Sinus bradycardia with intermittent wenckebach and telemetry with junctional tach.   ASSESSMENT: Chest pain Sinus bradycardia with wenckebach, junc rhythm Frailty   PLAN:  Chest pain - rule out for ACS - symptoms are atypical for angina.  - plan  on discharging with PRN NTG SL for pain in the future.   Sinus bradycardia with wenckebach, junc rhythm - pt not on rate controlling agents at this time - difficult to ascertain symptoms from her - conservative management at this time - no indication for PPM at this time  Ethics - DNR DNI  Dispo - home after acute illness resolves  Orson Gear, MD  12/16/2013  11:14 PM

## 2013-12-16 NOTE — ED Notes (Signed)
Pt ambulatory to bathroom with asst. Without any problems

## 2013-12-16 NOTE — ED Notes (Signed)
The pt has been holding her chest since yesterday.  She is not talking. Her daughter is speaking for her.  The pt has dementia

## 2013-12-17 ENCOUNTER — Encounter (HOSPITAL_COMMUNITY): Payer: Self-pay | Admitting: *Deleted

## 2013-12-17 DIAGNOSIS — R001 Bradycardia, unspecified: Secondary | ICD-10-CM | POA: Diagnosis not present

## 2013-12-17 DIAGNOSIS — I441 Atrioventricular block, second degree: Secondary | ICD-10-CM | POA: Diagnosis not present

## 2013-12-17 DIAGNOSIS — I495 Sick sinus syndrome: Secondary | ICD-10-CM | POA: Diagnosis not present

## 2013-12-17 DIAGNOSIS — R079 Chest pain, unspecified: Secondary | ICD-10-CM | POA: Diagnosis not present

## 2013-12-17 LAB — CBC
HCT: 33.4 % — ABNORMAL LOW (ref 36.0–46.0)
HEMOGLOBIN: 11.1 g/dL — AB (ref 12.0–15.0)
MCH: 27.3 pg (ref 26.0–34.0)
MCHC: 33.2 g/dL (ref 30.0–36.0)
MCV: 82.3 fL (ref 78.0–100.0)
Platelets: 266 10*3/uL (ref 150–400)
RBC: 4.06 MIL/uL (ref 3.87–5.11)
RDW: 14.7 % (ref 11.5–15.5)
WBC: 5 10*3/uL (ref 4.0–10.5)

## 2013-12-17 LAB — BASIC METABOLIC PANEL
Anion gap: 11 (ref 5–15)
BUN: 9 mg/dL (ref 6–23)
CHLORIDE: 108 meq/L (ref 96–112)
CO2: 23 meq/L (ref 19–32)
CREATININE: 0.7 mg/dL (ref 0.50–1.10)
Calcium: 9 mg/dL (ref 8.4–10.5)
GFR calc non Af Amer: 72 mL/min — ABNORMAL LOW (ref >90)
GFR, EST AFRICAN AMERICAN: 83 mL/min — AB (ref >90)
Glucose, Bld: 97 mg/dL (ref 70–99)
Potassium: 4.2 mEq/L (ref 3.7–5.3)
Sodium: 142 mEq/L (ref 137–147)

## 2013-12-17 MED ORDER — ACETAMINOPHEN 650 MG RE SUPP: 650.0000 mg | Freq: Four times a day (QID) | RECTAL | Status: DC | PRN

## 2013-12-17 MED ORDER — SODIUM CHLORIDE 0.9 % IJ SOLN: 3.0000 mL | INTRAMUSCULAR | Status: DC | PRN

## 2013-12-17 MED ORDER — SODIUM CHLORIDE 0.9 % IJ SOLN
3.0000 mL | Freq: Two times a day (BID) | INTRAMUSCULAR | Status: DC
  Administered 2013-12-17 (×3): 3 mL via INTRAVENOUS

## 2013-12-17 MED ORDER — HYDROCODONE-ACETAMINOPHEN 5-325 MG PO TABS: 1.0000 | ORAL_TABLET | Freq: Four times a day (QID) | ORAL | Status: DC | PRN

## 2013-12-17 MED ORDER — ASPIRIN EC 81 MG PO TBEC
81.0000 mg | DELAYED_RELEASE_TABLET | Freq: Every day | ORAL | Status: DC
  Administered 2013-12-17 – 2013-12-18 (×2): 81 mg via ORAL
  Filled 2013-12-17 (×2): qty 1

## 2013-12-17 MED ORDER — INFLUENZA VAC SPLIT QUAD 0.5 ML IM SUSY
0.5000 mL | PREFILLED_SYRINGE | INTRAMUSCULAR | Status: DC
Start: 2013-12-18 — End: 2013-12-18
  Filled 2013-12-17: qty 0.5

## 2013-12-17 MED ORDER — HYDROCODONE-ACETAMINOPHEN 5-325 MG PO TABS
1.0000 | ORAL_TABLET | ORAL | Status: DC | PRN
  Administered 2013-12-17: 1 via ORAL
  Filled 2013-12-17: qty 1

## 2013-12-17 MED ORDER — ONDANSETRON HCL 4 MG PO TABS: 4.0000 mg | ORAL_TABLET | Freq: Four times a day (QID) | ORAL | Status: DC | PRN

## 2013-12-17 MED ORDER — ONDANSETRON HCL 4 MG/2ML IJ SOLN: 4.0000 mg | Freq: Four times a day (QID) | INTRAMUSCULAR | Status: DC | PRN

## 2013-12-17 MED ORDER — HEPARIN SODIUM (PORCINE) 5000 UNIT/ML IJ SOLN
5000.0000 [IU] | Freq: Three times a day (TID) | INTRAMUSCULAR | Status: DC
  Administered 2013-12-17 (×3): 5000 [IU] via SUBCUTANEOUS
  Filled 2013-12-17 (×7): qty 1

## 2013-12-17 MED ORDER — SODIUM CHLORIDE 0.9 % IV SOLN: 250.0000 mL | INTRAVENOUS | Status: DC | PRN

## 2013-12-17 MED ORDER — PNEUMOCOCCAL VAC POLYVALENT 25 MCG/0.5ML IJ INJ
0.5000 mL | INJECTION | INTRAMUSCULAR | Status: DC
Start: 2013-12-18 — End: 2013-12-18
  Filled 2013-12-17: qty 0.5

## 2013-12-17 MED ORDER — ACETAMINOPHEN 325 MG PO TABS: 650.0000 mg | ORAL_TABLET | Freq: Four times a day (QID) | ORAL | Status: DC | PRN

## 2013-12-17 MED ORDER — SODIUM CHLORIDE 0.9 % IJ SOLN: 3.0000 mL | Freq: Two times a day (BID) | INTRAMUSCULAR | Status: DC

## 2013-12-17 MED ORDER — ZOLPIDEM TARTRATE 5 MG PO TABS: 5.0000 mg | ORAL_TABLET | Freq: Every evening | ORAL | Status: DC | PRN

## 2013-12-17 NOTE — Progress Notes (Signed)
Subjective:  Patient admitted with chest pain last night.  Has significant dementia and very difficult to get a history from her today.  Objective:  Vital Signs in the last 24 hours: BP 125/79 mmHg  Pulse 49  Temp(Src) 97.7 F (36.5 C) (Oral)  Resp 18  Wt 37.512 kg (82 lb 11.2 oz)  SpO2 99%  Physical Exam: Very thin black female currently in no acute distress, confused Lungs:  Clear Cardiac:  Irregular rhythm, normal S1 and S2, no S3 Extremities:  No edema present  Intake/Output from previous day:    Weight Filed Weights   12/17/13 0038  Weight: 37.512 kg (82 lb 11.2 oz)    Lab Results: Basic Metabolic Panel:  Recent Labs  12/16/13 1705 12/17/13 0547  NA 138 142  K 4.6 4.2  CL 100 108  CO2 26 23  GLUCOSE 124* 97  BUN 9 9  CREATININE 0.73 0.70   CBC:  Recent Labs  12/16/13 1705 12/17/13 0547  WBC 5.8 5.0  HGB 12.3 11.1*  HCT 36.5 33.4*  MCV 82.8 82.3  PLT 257 266   Cardiac Enzymes:  Recent Labs  12/16/13 1705 12/16/13 2005  TROPONINI <0.30 <0.30    Telemetry: Sinus rhythm with occasional Wenckebach as well as junctional beats  Assessment/Plan:  1.  Chest pain with atypical features-myocardial infarction ruled out-no ischemic changes on EKG 2.  Once bradycardia with type one week.  He block and occasional junctional rhythm, but no evidence of bradycardia or syncope that is symptomatic.  Recommendations:  No evidence of acute coronary syndrome.  With her age, dimensions and comorbidities a candidate for stress testing or invasive cardiac evaluation.  Unless documented bradycardia or syncope.  No indication for permanent pacemaker.     Kerry Hough  MD [facility]  12/17/2013, 10:47 AM

## 2013-12-18 DIAGNOSIS — R079 Chest pain, unspecified: Secondary | ICD-10-CM | POA: Diagnosis not present

## 2013-12-18 DIAGNOSIS — I480 Paroxysmal atrial fibrillation: Secondary | ICD-10-CM | POA: Diagnosis present

## 2013-12-18 DIAGNOSIS — I441 Atrioventricular block, second degree: Secondary | ICD-10-CM | POA: Diagnosis present

## 2013-12-18 DIAGNOSIS — I495 Sick sinus syndrome: Secondary | ICD-10-CM | POA: Diagnosis present

## 2013-12-18 LAB — BASIC METABOLIC PANEL
Anion gap: 16 — ABNORMAL HIGH (ref 5–15)
BUN: 13 mg/dL (ref 6–23)
CALCIUM: 9.2 mg/dL (ref 8.4–10.5)
CO2: 22 meq/L (ref 19–32)
Chloride: 108 mEq/L (ref 96–112)
Creatinine, Ser: 0.82 mg/dL (ref 0.50–1.10)
GFR calc Af Amer: 69 mL/min — ABNORMAL LOW (ref >90)
GFR calc non Af Amer: 59 mL/min — ABNORMAL LOW (ref >90)
GLUCOSE: 97 mg/dL (ref 70–99)
POTASSIUM: 4 meq/L (ref 3.7–5.3)
SODIUM: 146 meq/L (ref 137–147)

## 2013-12-18 LAB — CBC
HEMATOCRIT: 33.1 % — AB (ref 36.0–46.0)
Hemoglobin: 11.2 g/dL — ABNORMAL LOW (ref 12.0–15.0)
MCH: 28.5 pg (ref 26.0–34.0)
MCHC: 33.8 g/dL (ref 30.0–36.0)
MCV: 84.2 fL (ref 78.0–100.0)
Platelets: 252 10*3/uL (ref 150–400)
RBC: 3.93 MIL/uL (ref 3.87–5.11)
RDW: 14.9 % (ref 11.5–15.5)
WBC: 5.2 10*3/uL (ref 4.0–10.5)

## 2013-12-18 MED ORDER — ASPIRIN 81 MG PO TBEC: 81.0000 mg | DELAYED_RELEASE_TABLET | Freq: Every day | ORAL | Status: DC

## 2013-12-18 NOTE — Progress Notes (Signed)
Reviewed discharge instructions and f/u appointment with daughter. Educated daughter on medication regimen, daughter verbalized understanding. Pt. Discharged home medically stable and accompanied by daughter.

## 2013-12-18 NOTE — Clinical Documentation Improvement (Signed)
Dear Doctor - The progress note of 12/17/13 documents Stage III CKD, baseline creatinine is not known.  Also noted is that treatment plan with Xarelto changed because of worsening creatinine clearance.  BUN was 9 on 12/5 and 13 on 12/6.  Creatinine was 1.24 and 1.36. GFR is 41.   Possible Clinical Conditions?   Acute Renal Failure/Acute Kidney Injury Acute Tubular Necrosis Acute Renal Cortical Necrosis Acute Renal Medullary Necrosis Acute on Chronic Renal Failure Other Condition Cannot Clinically Determine    Thank You, Margretta Sidle ,RN Clinical Documentation Specialist:    [place] Clinical Conditions?

## 2013-12-18 NOTE — Discharge Summary (Signed)
Physician Discharge Summary     Cardiologist:  New- None assigned.   Patient ID: Roberta White MRN: [id number] DOB/AGE: [id number] 78 y.o.  Admit date: 12/16/2013 Discharge date: 12/18/2013  Admission Diagnoses:  Chest Pain  Discharge Diagnoses:  Principal Problem:   Chest pain Active Problems:   Sinus bradycardia   Second degree AV block, Mobitz type I   PAF (paroxysmal atrial fibrillation)   Tachy-brady syndrome   Discharged Condition: stable  Hospital Course:    Roberta White is a 80 y.o african [place] female with reported history of dementia, DVT, PE, DM who presented with chest pain.  Pt lives at home with her daughter. HPI is obtained from daughter. Pt complained of chest pain yesterday as daughter was taking her to the bathroom. Pain was in anterior chest and resolved after five minutes. No associated dyspnea, nausea, diaphoresis. Pt again had a similar episode today as she was being talked to take shower. Similar to the day before without any associated symptoms. At this time, daughter decided to bring patient to the emergency department. Pt has no history of chest pain. ROS negative for fever, chills, nausea, vomiting, abd pain, LE edema, PND, DOE. Pt denies palpitations. No complaints of fatigue. However, daughter does feel that patient has been sleeping more than usual lately.   She was admitted for observation and ruled out for MI.  She was placed on low dose ASA.  CXR revealed nothing acute.   Review of telemetry revealed Afib RVR(120's), Sinus bradycardia upper 30-40, second degree AVB type one. No AV nodal blocking agents. DNR. Not a pacer, stress test or anticoagulation candidate.  Will leave on low dose ASA.  Follow up arranged.  The patient was seen by Dr. Burt Knack who felt she was stable for DC home.    Consults: None  Significant Diagnostic Studies:  CHEST 2 VIEW  COMPARISON: Chest radiograph 08/12/2011  FINDINGS: Stable enlarged cardiac and mediastinal  contours. Stable minimal heterogeneous opacities bilateral lung bases. Consolidative pulmonary opacities. No pleural effusion or pneumothorax. Mid thoracic spine degenerative change.  IMPRESSION: No acute cardiopulmonary process.  Minimal opacities bilateral lung bases may represent scarring and or atelectasis.  Treatments: See above  Discharge Exam: Blood pressure 111/63, pulse 91, temperature 98.4 F (36.9 C), temperature source Oral, resp. rate 18, height 4\' 4"  (1.321 m), weight 82 lb 11.2 oz (37.512 kg), SpO2 100 %.   Disposition: 01-Home or Self Care      Discharge Instructions    Diet - low sodium heart healthy    Complete by:  As directed      Increase activity slowly    Complete by:  As directed             Medication List    TAKE these medications        aspirin 81 MG EC tablet  Take 1 tablet (81 mg total) by mouth daily.     HYDROcodone-acetaminophen 5-325 MG per tablet  Commonly known as:  NORCO/VICODIN  Take 1 tablet by mouth every 6 (six) hours as needed for severe pain.       Follow-up Information    Follow up with Richardson Dopp, PA-C On 01/04/2014.   Specialty:  Physician Assistant   Why:  11:30 AM   Contact information:   [address] [place] [address] [phone]       Signed: Tarri Fuller, [facility] 12/18/2013, 11:10 AM

## 2013-12-18 NOTE — Progress Notes (Signed)
CARE MANAGEMENT NOTE 12/18/2013  Patient:  VAISHALI, BAISE   Account Number:  [id number]  Date Initiated:  12/18/2013  Documentation initiated by:  [facility]  Subjective/Objective Assessment:   chest pain     Action/Plan:   lives with dtr   Anticipated DC Date:  12/18/2013   Anticipated DC Plan:  [place]  CM consult      Choice offered to / List presented to:             Status of service:  Completed, signed off Medicare Important Message given?  YES (If response is "NO", the following Medicare IM given date fields will be blank) Date Medicare IM given:  12/18/2013 Medicare IM given by:  [facility] Date Additional Medicare IM given:   Additional Medicare IM given by:    Discharge Disposition:  HOME/SELF CARE  Per UR Regulation:    If discussed at Long Length of Stay Meetings, dates discussed:    Comments:  12/18/2013 1500 NCM spoke to pt and states she lives with dtr, Caren Griffins [phone]. Attempted call to dtr. Jonnie Finner RN CCM Case Mgmt phone [phone]

## 2013-12-18 NOTE — Progress Notes (Signed)
Attempted to contact patient's Daughter, Esmond Camper to inform her that patient is getting discharged. Received no answer and no way to leave a message at this time. RN will continue to reach out to family member. Alfredo Bach RN BSN 12/18/2013 1:47 PM

## 2013-12-18 NOTE — Progress Notes (Signed)
CARE MANAGEMENT NOTE 12/18/2013  Patient:  Roberta White, Roberta White   Account Number:  [id number]  Date Initiated:  12/18/2013  Documentation initiated by:  [facility]  Subjective/Objective Assessment:   chest pain     Action/Plan:   lives with dtr   Anticipated DC Date:  12/18/2013   Anticipated DC Plan:  [place]  CM consult      Choice offered to / List presented to:             Status of service:  Completed, signed off Medicare Important Message given?  YES (If response is "NO", the following Medicare IM given date fields will be blank) Date Medicare IM given:  12/18/2013 Medicare IM given by:  [facility] Date Additional Medicare IM given:   Additional Medicare IM given by:    Discharge Disposition:  HOME/SELF CARE  Per UR Regulation:    If discussed at Long Length of Stay Meetings, dates discussed:    Comments:  12/18/2013 1550 NCM spoke to pt's dtr and requesting hospital. Referral called to [facility]. Received call back from [facility] and order not complete. NCM attempted to updated order for hospital bed and no qualifying info to add to referral. Contacted dtr to explain hospital bed will out of pocket if not covered by insurance. Pt's dtr refusing hospital bed at this time. Per dtr, pt has [place] (aide) for 4 hours a day, six days per week from [place]. Jonnie Finner RN CCM Case Mgmt phone [phone]  12/18/2013 1500 NCM spoke to pt and states she lives with dtr, Roberta White [phone]. Attempted call to dtr. Jonnie Finner RN CCM Case Mgmt phone [phone]

## 2013-12-18 NOTE — Progress Notes (Signed)
Subjective: No apparent complaints  Objective: Vital signs in last 24 hours: Temp:  [97.7 F (36.5 C)-98.4 F (36.9 C)] 98.4 F (36.9 C) (12/06 2011) Pulse Rate:  [76-91] 91 (12/07 0543) Resp:  [18] 18 (12/07 0543) BP: (101-146)/(56-77) 111/63 mmHg (12/07 0543) SpO2:  [100 %] 100 % (12/06 2011)    Intake/Output from previous day: 12/06 0701 - 12/07 0700 In: 75 [P.O.:420] Out: -  Intake/Output this shift:    Medications Current Facility-Administered Medications  Medication Dose Route Frequency Provider Last Rate Last Dose  . 0.9 %  sodium chloride infusion  250 mL Intravenous PRN Orson Gear, MD      . acetaminophen (TYLENOL) tablet 650 mg  650 mg Oral Q6H PRN Orson Gear, MD       Or  . acetaminophen (TYLENOL) suppository 650 mg  650 mg Rectal Q6H PRN Orson Gear, MD      . aspirin EC tablet 81 mg  81 mg Oral Daily Orson Gear, MD   81 mg at 12/17/13 1100  . heparin injection 5,000 Units  5,000 Units Subcutaneous 3 times per day Orson Gear, MD   5,000 Units at 12/17/13 2211  . HYDROcodone-acetaminophen (NORCO/VICODIN) 5-325 MG per tablet 1-2 tablet  1-2 tablet Oral Q4H PRN Orson Gear, MD   1 tablet at 12/17/13 0155  . Influenza vac split quadrivalent PF (FLUARIX) injection 0.5 mL  0.5 mL Intramuscular Tomorrow-1000 Orson Gear, MD      . ondansetron (ZOFRAN) tablet 4 mg  4 mg Oral Q6H PRN Orson Gear, MD       Or  . ondansetron (ZOFRAN) injection 4 mg  4 mg Intravenous Q6H PRN Orson Gear, MD      . pneumococcal 23 valent vaccine (PNU-IMMUNE) injection 0.5 mL  0.5 mL Intramuscular Tomorrow-1000 Orson Gear, MD      . sodium chloride 0.9 % injection 3 mL  3 mL Intravenous Q12H Orson Gear, MD   0 mL at 12/17/13 0244  . sodium chloride 0.9 % injection 3 mL  3 mL Intravenous Q12H Orson Gear, MD   3 mL at 12/17/13 2211  . sodium chloride 0.9 % injection 3 mL  3 mL Intravenous PRN Orson Gear, MD      . zolpidem (AMBIEN) tablet 5 mg  5 mg Oral QHS PRN Orson Gear, MD        PE: General appearance: alert, cooperative and no distress Lungs: She would not stop talking.  Heart: regular rate and rhythm Pulses: 2+ and symmetric Skin: Warm and dry Neurologic: Grossly normal  Lab Results:   Recent Labs  12/16/13 1705 12/17/13 0547 12/18/13 0318  WBC 5.8 5.0 5.2  HGB 12.3 11.1* 11.2*  HCT 36.5 33.4* 33.1*  PLT 257 266 252   BMET  Recent Labs  12/16/13 1705 12/17/13 0547 12/18/13 0318  NA 138 142 146  K 4.6 4.2 4.0  CL 100 108 108  CO2 26 23 22   GLUCOSE 124* 97 97  BUN 9 9 13   CREATININE 0.73 0.70 0.82  CALCIUM 9.3 9.0 9.2    Assessment/Plan 80 y.o african [place] female with reported history of dementia, DVT, PE, DM who presented with chest pain.     Chest pain   Sinus bradycardia   Tachy brady   PAF  Plan:  Ruled out for MI.  Telemetry reveals Afib RVR(120's), Sinus bradycardia upper 30-40, second degree AVB type one.  To AV nodal blocking agents.  DNR.  Not a pacer  or anticoagulation candidate.  DC home today.  Lives with daughter.    LOS: 2 days    HAGER, BRYAN PA-C 12/18/2013 9:52 AM  Patient seen, examined. Available data reviewed. Agree with findings, assessment, and plan as outlined by Tarri Fuller, PA-C. Elderly woman, pleasant but confused. No CP or other complaints this am. Heart is RRR without murmur. Lungs are clear, no peripheral edema.   Chart reviewed. She has no objective evidence of ischemia. With advanced age and dementia, she is not an appropriate candidate for cardiac evaluation. Currently asymptomatic. Will d/c home with her daughter today.   Sherren Mocha, M.D. 12/18/2013 10:26 AM

## 2013-12-23 ENCOUNTER — Encounter (HOSPITAL_COMMUNITY): Payer: Self-pay | Admitting: Emergency Medicine

## 2013-12-23 DIAGNOSIS — Z86718 Personal history of other venous thrombosis and embolism: Secondary | ICD-10-CM | POA: Insufficient documentation

## 2013-12-23 DIAGNOSIS — E119 Type 2 diabetes mellitus without complications: Secondary | ICD-10-CM | POA: Diagnosis not present

## 2013-12-23 DIAGNOSIS — R0789 Other chest pain: Secondary | ICD-10-CM | POA: Diagnosis not present

## 2013-12-23 DIAGNOSIS — R0602 Shortness of breath: Secondary | ICD-10-CM | POA: Insufficient documentation

## 2013-12-23 DIAGNOSIS — D649 Anemia, unspecified: Secondary | ICD-10-CM | POA: Diagnosis not present

## 2013-12-23 DIAGNOSIS — Z86711 Personal history of pulmonary embolism: Secondary | ICD-10-CM | POA: Diagnosis not present

## 2013-12-23 DIAGNOSIS — F0390 Unspecified dementia without behavioral disturbance: Secondary | ICD-10-CM | POA: Insufficient documentation

## 2013-12-23 DIAGNOSIS — R079 Chest pain, unspecified: Secondary | ICD-10-CM | POA: Diagnosis present

## 2013-12-23 NOTE — ED Notes (Signed)
Pt. reports intermittent mid chest pain with occasional productive cough onset yesterday , denies nausea or diaphoresis .

## 2013-12-24 ENCOUNTER — Emergency Department (HOSPITAL_COMMUNITY)
Admission: EM | Admit: 2013-12-24 | Discharge: 2013-12-24 | Disposition: A | Discharge status: Home / Self Care | Payer: Medicare Other | Attending: Emergency Medicine | Admitting: Emergency Medicine

## 2013-12-24 ENCOUNTER — Emergency Department (HOSPITAL_COMMUNITY): Payer: Medicare Other

## 2013-12-24 DIAGNOSIS — R0789 Other chest pain: Secondary | ICD-10-CM | POA: Diagnosis not present

## 2013-12-24 DIAGNOSIS — D649 Anemia, unspecified: Secondary | ICD-10-CM

## 2013-12-24 DIAGNOSIS — R079 Chest pain, unspecified: Secondary | ICD-10-CM

## 2013-12-24 LAB — BASIC METABOLIC PANEL
Anion gap: 12 (ref 5–15)
BUN: 10 mg/dL (ref 6–23)
CHLORIDE: 100 meq/L (ref 96–112)
CO2: 23 mEq/L (ref 19–32)
CREATININE: 0.79 mg/dL (ref 0.50–1.10)
Calcium: 9.1 mg/dL (ref 8.4–10.5)
GFR calc non Af Amer: 69 mL/min — ABNORMAL LOW (ref >90)
GFR, EST AFRICAN AMERICAN: 80 mL/min — AB (ref >90)
GLUCOSE: 108 mg/dL — AB (ref 70–99)
Potassium: 4.3 mEq/L (ref 3.7–5.3)
Sodium: 135 mEq/L — ABNORMAL LOW (ref 137–147)

## 2013-12-24 LAB — CBC
HEMATOCRIT: 32.2 % — AB (ref 36.0–46.0)
HEMOGLOBIN: 10.8 g/dL — AB (ref 12.0–15.0)
MCH: 27.7 pg (ref 26.0–34.0)
MCHC: 33.5 g/dL (ref 30.0–36.0)
MCV: 82.6 fL (ref 78.0–100.0)
Platelets: 289 10*3/uL (ref 150–400)
RBC: 3.9 MIL/uL (ref 3.87–5.11)
RDW: 15 % (ref 11.5–15.5)
WBC: 4.7 10*3/uL (ref 4.0–10.5)

## 2013-12-24 LAB — I-STAT TROPONIN, ED
Troponin i, poc: 0.01 ng/mL (ref 0.00–0.08)
Troponin i, poc: 0.01 ng/mL (ref 0.00–0.08)

## 2013-12-24 NOTE — ED Notes (Signed)
Pt waiting on daughter to come back prior to discharge

## 2013-12-24 NOTE — ED Provider Notes (Signed)
CSN: [id number]     Arrival date & time 12/23/13  2308 History  This chart was scribed for Roberta Fuel, MD by Evelene Croon, ED Scribe. This patient was seen in room B16C/B16C and the patient's care was started 3:13 AM.    Chief Complaint  Patient presents with  . Chest Pain   Level 5 Caveat due to patients h/o dementia  The history is provided by the patient, a relative and medical records. No language interpreter was used.     HPI Comments:  Roberta White is a 80 y.o. female who presents to the Emergency Department complaining of intermittent non-radiating central CP that started around 2000 yesterday(12/23/13) . She reports associated mild SOB. She denies nausea, vomiting and diaphoresis. Pt denies CP at this time. She was seen in the ED about one week ago for CP. No alleviating factors noted.   Past Medical History  Diagnosis Date  . DVT (deep venous thrombosis)   . Pulmonary embolism   . Dementia   . Diabetes mellitus without complication   . Dementia    Past Surgical History  Procedure Laterality Date  . Cataract extraction w/phaco  08/12/2011    Procedure: CATARACT EXTRACTION PHACO AND INTRAOCULAR LENS PLACEMENT (IOC);  Surgeon: Adonis Brook, MD;  Location: [facility];  Service: Ophthalmology;  Laterality: Left;   No family history on file. History  Substance Use Topics  . Smoking status: Never Smoker   . Smokeless tobacco: Not on file  . Alcohol Use: No   OB History    No data available     Review of Systems  Unable to perform ROS: Dementia  Constitutional: Negative for diaphoresis.  Respiratory: Positive for shortness of breath.   Cardiovascular: Positive for chest pain.  Gastrointestinal: Negative for nausea and vomiting.      Allergies  Review of patient's allergies indicates no known allergies.  Home Medications   Prior to Admission medications   Medication Sig Start Date End Date Taking? Authorizing Provider  aspirin EC 81 MG EC tablet Take 1 tablet (81  mg total) by mouth daily. 12/18/13  Yes Brett Canales, PA-C  HYDROcodone-acetaminophen (NORCO/VICODIN) 5-325 MG per tablet Take 1 tablet by mouth every 6 (six) hours as needed for severe pain. 03/11/13  Yes Rhetta Mura Schorr, NP   BP 148/94 mmHg  Pulse 57  Temp(Src) 98.3 F (36.8 C) (Oral)  Resp 14  SpO2 99% Physical Exam  Constitutional:  Elderly and frail.  HENT:  Head: Normocephalic and atraumatic.  Eyes: Pupils are equal, round, and reactive to light.  Bilateral conjunctival injection with eversion of lower lids.  Neck: Normal range of motion. Neck supple. No JVD present.  Cardiovascular: Normal rate, regular rhythm and normal heart sounds.   No murmur heard. Pulmonary/Chest: Effort normal and breath sounds normal. She has no rales.  Abdominal: Soft. Bowel sounds are normal. She exhibits no distension and no mass. There is no tenderness.  Musculoskeletal: Normal range of motion. She exhibits no edema.  Lymphadenopathy:    She has no cervical adenopathy.  Neurological: She is alert. She has normal reflexes. No cranial nerve deficit. Coordination normal.  Skin: Skin is warm and dry. No rash noted.  Psychiatric: She has a normal mood and affect. Her behavior is normal.  Nursing note and vitals reviewed.   ED Course  Procedures  DIAGNOSTIC STUDIES:  Oxygen Saturation is 99% on RA, normal by my interpretation.    COORDINATION OF CARE:  3:19 AM Discussed treatment plan  with pt and family at bedside and they agreed to plan.  Labs Review Results for orders placed or performed during the hospital encounter of 12/24/13  CBC  Result Value Ref Range   WBC 4.7 4.0 - 10.5 K/uL   RBC 3.90 3.87 - 5.11 MIL/uL   Hemoglobin 10.8 (L) 12.0 - 15.0 g/dL   HCT 32.2 (L) 36.0 - 46.0 %   MCV 82.6 78.0 - 100.0 fL   MCH 27.7 26.0 - 34.0 pg   MCHC 33.5 30.0 - 36.0 g/dL   RDW 15.0 11.5 - 15.5 %   Platelets 289 150 - 400 K/uL  Basic metabolic panel  Result Value Ref Range   Sodium 135 (L)  137 - 147 mEq/L   Potassium 4.3 3.7 - 5.3 mEq/L   Chloride 100 96 - 112 mEq/L   CO2 23 19 - 32 mEq/L   Glucose, Bld 108 (H) 70 - 99 mg/dL   BUN 10 6 - 23 mg/dL   Creatinine, Ser 0.79 0.50 - 1.10 mg/dL   Calcium 9.1 8.4 - 10.5 mg/dL   GFR calc non Af Amer 69 (L) >90 mL/min   GFR calc Af Amer 80 (L) >90 mL/min   Anion gap 12 5 - 15  I-stat troponin, ED (not at [facility])  Result Value Ref Range   Troponin i, poc 0.01 0.00 - 0.08 ng/mL   Comment 3          I-stat troponin, ED  Result Value Ref Range   Troponin i, poc 0.01 0.00 - 0.08 ng/mL   Comment 3           Imaging Review Dg Chest 2 View  12/24/2013   CLINICAL DATA:  Right-sided chest pain for 2-3 days. History of DVT and pulmonary embolus.  EXAM: CHEST  2 VIEW  COMPARISON:  12/16/2013  FINDINGS: Cardiac enlargement without vascular congestion. Diffuse emphysematous changes in the lungs with central interstitial changes consistent with chronic bronchitis. No focal airspace disease in the lungs. No blunting of costophrenic angles. No pneumothorax. Calcified and tortuous aorta. Degenerative changes in the spine. No change since prior study.  IMPRESSION: Emphysematous changes and chronic bronchitic changes in the chest. No evidence of active pulmonary disease.   Electronically Signed   By: Lucienne Capers M.D.   On: 12/24/2013 01:05     EKG Interpretation   Date/Time:  Saturday December 23 2013 23:15:47 EST Ventricular Rate:  87 PR Interval:  238 QRS Duration: 62 QT Interval:  372 QTC Calculation: 447 R Axis:   32 Text Interpretation:  Sinus rhythm with 1st degree A-V block with  Premature atrial complexes with Abberant conduction Low voltage QRS Cannot  rule out Anterior infarct , age undetermined Abnormal ECG When compared  with ECG of 12/17/2013, Premature atrial complexes are now Present  Confirmed by [facility]  MD, Maedell Hedger ([id number]) on 12/24/2013 1:41:35 AM      MDM   Final diagnoses:  Chest pain, unspecified chest pain type   Normochromic normocytic anemia    Chest pain of uncertain cause. Old records are reviewed and she had a recent hospitalization for chest pain and she was not felt to be a candidate for any Procedures including stress test or pacemaker. She is DO NOT RESUSCITATE and it was elected to treat her pain symptomatically. She has been pain-free for several hours. ECG shows no acute changes. Initial troponin was negative. Repeat troponin is also negative. She has continued to be pain-free so she is discharged with  instructions to follow-up with her PCP.  I personally performed the services described in this documentation, which was scribed in my presence. The recorded information has been reviewed and is accurate.     Roberta Fuel, MD 73/66/81 [id number]

## 2013-12-24 NOTE — Discharge Instructions (Signed)

## 2013-12-25 ENCOUNTER — Encounter (HOSPITAL_COMMUNITY): Payer: Self-pay | Admitting: Emergency Medicine

## 2013-12-25 ENCOUNTER — Emergency Department (HOSPITAL_COMMUNITY)
Admission: EM | Admit: 2013-12-25 | Discharge: 2013-12-25 | Disposition: A | Discharge status: Home / Self Care | Payer: Medicare Other | Attending: Emergency Medicine | Admitting: Emergency Medicine

## 2013-12-25 ENCOUNTER — Emergency Department (HOSPITAL_COMMUNITY): Payer: Medicare Other

## 2013-12-25 DIAGNOSIS — I712 Thoracic aortic aneurysm, without rupture, unspecified: Secondary | ICD-10-CM

## 2013-12-25 DIAGNOSIS — E119 Type 2 diabetes mellitus without complications: Secondary | ICD-10-CM | POA: Diagnosis not present

## 2013-12-25 DIAGNOSIS — F0390 Unspecified dementia without behavioral disturbance: Secondary | ICD-10-CM | POA: Insufficient documentation

## 2013-12-25 DIAGNOSIS — Z7982 Long term (current) use of aspirin: Secondary | ICD-10-CM | POA: Diagnosis not present

## 2013-12-25 DIAGNOSIS — R079 Chest pain, unspecified: Secondary | ICD-10-CM

## 2013-12-25 DIAGNOSIS — Z86711 Personal history of pulmonary embolism: Secondary | ICD-10-CM | POA: Insufficient documentation

## 2013-12-25 DIAGNOSIS — Z86718 Personal history of other venous thrombosis and embolism: Secondary | ICD-10-CM | POA: Insufficient documentation

## 2013-12-25 DIAGNOSIS — R0789 Other chest pain: Secondary | ICD-10-CM | POA: Insufficient documentation

## 2013-12-25 LAB — CBC WITH DIFFERENTIAL/PLATELET
Basophils Absolute: 0 10*3/uL (ref 0.0–0.1)
Basophils Relative: 1 % (ref 0–1)
Eosinophils Absolute: 0.3 10*3/uL (ref 0.0–0.7)
Eosinophils Relative: 6 % — ABNORMAL HIGH (ref 0–5)
HCT: 33.3 % — ABNORMAL LOW (ref 36.0–46.0)
HEMOGLOBIN: 11.1 g/dL — AB (ref 12.0–15.0)
Lymphocytes Relative: 36 % (ref 12–46)
Lymphs Abs: 2 10*3/uL (ref 0.7–4.0)
MCH: 27.5 pg (ref 26.0–34.0)
MCHC: 33.3 g/dL (ref 30.0–36.0)
MCV: 82.6 fL (ref 78.0–100.0)
MONOS PCT: 14 % — AB (ref 3–12)
Monocytes Absolute: 0.8 10*3/uL (ref 0.1–1.0)
NEUTROS ABS: 2.5 10*3/uL (ref 1.7–7.7)
Neutrophils Relative %: 43 % (ref 43–77)
Platelets: 294 10*3/uL (ref 150–400)
RBC: 4.03 MIL/uL (ref 3.87–5.11)
RDW: 15 % (ref 11.5–15.5)
WBC: 5.7 10*3/uL (ref 4.0–10.5)

## 2013-12-25 LAB — COMPREHENSIVE METABOLIC PANEL
ALBUMIN: 2.8 g/dL — AB (ref 3.5–5.2)
ALT: 8 U/L (ref 0–35)
ANION GAP: 12 (ref 5–15)
AST: 17 U/L (ref 0–37)
Alkaline Phosphatase: 77 U/L (ref 39–117)
BILIRUBIN TOTAL: 0.4 mg/dL (ref 0.3–1.2)
BUN: 13 mg/dL (ref 6–23)
CHLORIDE: 103 meq/L (ref 96–112)
CO2: 23 mEq/L (ref 19–32)
Calcium: 9.1 mg/dL (ref 8.4–10.5)
Creatinine, Ser: 0.81 mg/dL (ref 0.50–1.10)
GFR calc non Af Amer: 60 mL/min — ABNORMAL LOW (ref >90)
GFR, EST AFRICAN AMERICAN: 70 mL/min — AB (ref >90)
GLUCOSE: 102 mg/dL — AB (ref 70–99)
Potassium: 4.7 mEq/L (ref 3.7–5.3)
SODIUM: 138 meq/L (ref 137–147)
Total Protein: 7.4 g/dL (ref 6.0–8.3)

## 2013-12-25 LAB — TROPONIN I
Troponin I: <0.3 ng/mL (ref <0.30)
Troponin I: <0.3 ng/mL (ref <0.30)

## 2013-12-25 LAB — LIPASE, BLOOD: Lipase: 34 U/L (ref 11–59)

## 2013-12-25 MED ORDER — IOHEXOL 350 MG/ML SOLN
80.0000 mL | Freq: Once | INTRAVENOUS | Status: AC | PRN
  Administered 2013-12-25: 80 mL via INTRAVENOUS

## 2013-12-25 MED ORDER — SODIUM CHLORIDE 0.9 % IV SOLN
INTRAVENOUS | Status: DC
  Administered 2013-12-25: 22:00:00 via INTRAVENOUS

## 2013-12-25 NOTE — Discharge Instructions (Signed)
Follow-up Information    Follow up with Richardson Dopp, PA-C On 01/04/2014.   Specialty: Physician Assistant   Why: 11:30 AM   Contact information:   [address] [place] [address] [phone]     You were found to have a 3.8 cm ascending thoracic aortic aneurysm without rupture or dissection. This will need to be followed by your primary care physician yearly. They may refer you to a vascular surgeon.  You had 2 negative sets of cardiac labs. Your vital signs are normal. No sign of pneumonia or fluid on your lungs. EKG showed no changes. Please follow-up with your PCP and cardiologist as scheduled. You had no chest pain in the emergency department for over 6 hours.   Chest Pain (Nonspecific) It is often hard to give a specific diagnosis for the cause of chest pain. There is always a chance that your pain could be related to something serious, such as a heart attack or a blood clot in the lungs. You need to follow up with your health care provider for further evaluation. CAUSES   Heartburn.  Pneumonia or bronchitis.  Anxiety or stress.  Inflammation around your heart (pericarditis) or lung (pleuritis or pleurisy).  A blood clot in the lung.  A collapsed lung (pneumothorax). It can develop suddenly on its own (spontaneous pneumothorax) or from trauma to the chest.  Shingles infection (herpes zoster virus). The chest wall is composed of bones, muscles, and cartilage. Any of these can be the source of the pain.  The bones can be bruised by injury.  The muscles or cartilage can be strained by coughing or overwork.  The cartilage can be affected by inflammation and become sore (costochondritis). DIAGNOSIS  Lab tests or other studies may be needed to find the cause of your pain. Your health care provider may have you take a test called an ambulatory electrocardiogram (ECG). An ECG records your heartbeat patterns over a 24-hour period. You may also  have other tests, such as:  Transthoracic echocardiogram (TTE). During echocardiography, sound waves are used to evaluate how blood flows through your heart.  Transesophageal echocardiogram (TEE).  Cardiac monitoring. This allows your health care provider to monitor your heart rate and rhythm in real time.  Holter monitor. This is a portable device that records your heartbeat and can help diagnose heart arrhythmias. It allows your health care provider to track your heart activity for several days, if needed.  Stress tests by exercise or by giving medicine that makes the heart beat faster. TREATMENT   Treatment depends on what may be causing your chest pain. Treatment may include:  Acid blockers for heartburn.  Anti-inflammatory medicine.  Pain medicine for inflammatory conditions.  Antibiotics if an infection is present.  You may be advised to change lifestyle habits. This includes stopping smoking and avoiding alcohol, caffeine, and chocolate.  You may be advised to keep your head raised (elevated) when sleeping. This reduces the chance of acid going backward from your stomach into your esophagus. Most of the time, nonspecific chest pain will improve within 2-3 days with rest and mild pain medicine.  HOME CARE INSTRUCTIONS   If antibiotics were prescribed, take them as directed. Finish them even if you start to feel better.  For the next few days, avoid physical activities that bring on chest pain. Continue physical activities as directed.  Do not use any tobacco products, including cigarettes, chewing tobacco, or electronic cigarettes.  Avoid drinking alcohol.  Only take  medicine as directed by your health care provider.  Follow your health care provider's suggestions for further testing if your chest pain does not go away.  Keep any follow-up appointments you made. If you do not go to an appointment, you could develop lasting (chronic) problems with pain. If there is any  problem keeping an appointment, call to reschedule. SEEK MEDICAL CARE IF:   Your chest pain does not go away, even after treatment.  You have a rash with blisters on your chest.  You have a fever. SEEK IMMEDIATE MEDICAL CARE IF:   You have increased chest pain or pain that spreads to your arm, neck, jaw, back, or abdomen.  You have shortness of breath.  You have an increasing cough, or you cough up blood.  You have severe back or abdominal pain.  You feel nauseous or vomit.  You have severe weakness.  You faint.  You have chills. This is an emergency. Do not wait to see if the pain will go away. Get medical help at once. Call your local emergency services (911 in [place] Do not drive yourself to the hospital. MAKE SURE YOU:   Understand these instructions.  Will watch your condition.  Will get help right away if you are not doing well or get worse. Document Released: 10/08/2004 Document Revised: 01/03/2013 Document Reviewed: 08/04/2007 [facility] Patient Information 2015 [place], [place]. This information is not intended to replace advice given to you by your health care provider. Make sure you discuss any questions you have with your health care provider.  Thoracic Aortic Aneurysm An aneurysm is a bulge in an artery. It happens when the wall of the artery is weakened or damaged. If the aneurysm gets too big, it bursts (ruptures) and severe bleeding occurs. A thoracic aortic aneurysm is an aneurysm that occurs in the first part of the aorta, between the heart and the diaphragm. The aorta is the main artery and supplies blood from the heart to the rest of the body. A thoracic aortic aneurysm can enlarge and rupture or blood can flow between the layers of the wall of the aorta through a tear (aorticdissection). Both of these conditions can cause bleeding inside the body and can be life threatening unless diagnosed and treated promptly. CAUSES  The exact cause of a thoracic aortic  aneurysm is often unknown. Some contributing factors are:   A hardening of the arteries caused by the buildup of fat and other substances in the lining of a blood vessel (arteriosclerosis).  Inflammation of the walls of an artery (arteritis).  Connective tissue diseases, such as Marfan syndrome.  Injury or trauma to the aorta.  An infection, such as syphilis or staphylococcus, in the wall of the aorta (infectious aortitis) caused by bacteria. RISK FACTORS  Risk factors that contribute to a thoracic aortic aneurysm may include:  Age older than 36 years.  High blood pressure (hypertension).  Female gender.  Ethnicity (white race).  Obesity.  Family history of aneurysm (first degree relatives only).  Tobacco use. PREVENTION  The following healthy lifestyle habits may help decrease your risk of a thoracic aortic aneurysm:  Quitting smoking. Smoking can raise your blood pressure and cause arteriosclerosis.  Limiting or avoiding alcohol.  Keeping your blood pressure, blood sugar level, and cholesterol levels within normal limits.  Decreasing your salt intake. In some people, too much salt can raise blood pressure and increase your risk of abdominal aortic aneurysm.  Eating a diet low in saturated fats and cholesterol.  Increasing your fiber intake by including whole grains, vegetables, and fruits in your diet. Eating these foods may help lower blood pressure.  Maintaining a healthy weight.  Staying physically active and exercising regularly. SYMPTOMS  The symptoms of thoracic aortic aneurysm may vary depending on the size and rate of growth of the aneurysm. Most grow slowly and do not have any symptoms. When symptoms do occur, they may include:  Pain (chest, back, sides, or abdomen). The pain may vary in intensity. A sudden onset of severe pain may indicate that the aneurysm has ruptured.  Hoarseness.  Cough.  Shortness of breath.  Swallowing problems.  Nausea or  vomiting or both. DIAGNOSIS  Since most unruptured thoracic aortic aneurysms have no symptoms, they are often discovered during diagnostic exams for other conditions. An aneurysm may be found during the following procedures:  Ultrasonography (a one-time screening for thoracic aortic aneurysm by ultrasonography is also recommended for all men aged 12-75 years who have ever smoked).  X-ray exams.  A CT scan.  An MRI.  Angiography or arteriography. TREATMENT  Treatment of a thoracic aortic aneurysm depends on the size of your aneurysm, your age, and risk factors for rupture. Medicine to control blood pressure and pain may be used to manage aneurysms smaller than 2.3 in (6 cm). Regular monitoring for enlargement may be recommended by your health care provider if:  The aneurysm is 1.2-1.5 in (3-4 cm) in size (an annual ultrasonography may be recommended).  The aneurysm is 1.5-1.8 in (4-4.5 cm) in size (an ultrasonography every 6 months may be recommended).  The aneurysm is larger than 1.8 in (4.5 cm) in size (your health care provider may ask that you be examined by a vascular surgeon). If your aneurysm is larger than 2.2 in (5.5 cm) or if it is enlarging quickly, surgical repair may be recommended. There are two main methods for repair of an aneurysm:   Endovascular repair (a minimally invasive surgery).  Open repair. This method is used if an endovascular repair is not possible. Document Released: 12/29/2004 Document Revised: 10/19/2012 Document Reviewed: 07/11/2012 [facility] Patient Information 2015 [place], [place]. This information is not intended to replace advice given to you by your health care provider. Make sure you discuss any questions you have with your health care provider.

## 2013-12-25 NOTE — ED Notes (Signed)
Per EMS, family called out today to have pt evaluated for CP that she has been having over the last 2 weeks. NAD at this time. Pt received 324 of aspirin in route. Pt also has brady-tachy syndrom and hx of second degree block.

## 2013-12-25 NOTE — ED Provider Notes (Signed)
TIME SEEN: 6:10 PM  CHIEF COMPLAINT: Chest pain  HPI: Pt is a 80 y.o. F with history of prior pulmonary embolus, DVT, dementia, diabetes who presents to the emergency department with her family for complaints of intermittent chest pain for the past 2 weeks.  Patient is unable to provide any history given her history of dementia. This is the family's third visit in the past week for the same symptoms. Therefore the patient was complaining of chest pain around 2 PM today. Patient is unable to recall this episode or describe what this pain felt like to me. She is unable to tell me what made it worse or better. Unable to tell me if there are any associated symptoms. She was recently admitted to the hospital and was found to have a second-degree AV block, Mobitz type I. She was seen by cardiology who deemed her to not be a pacemaker candidate. Family is here again today for evaluation for chest pain. Patient denies having any pain currently. Denies the patient has had any cough, fever.  ROS: Level V caveat for dementia  PAST MEDICAL HISTORY/PAST SURGICAL HISTORY:  Past Medical History  Diagnosis Date  . DVT (deep venous thrombosis)   . Pulmonary embolism   . Dementia   . Diabetes mellitus without complication   . Dementia     MEDICATIONS:  Prior to Admission medications   Medication Sig Start Date End Date Taking? Authorizing Provider  aspirin EC 81 MG EC tablet Take 1 tablet (81 mg total) by mouth daily. 12/18/13  Yes Brett Canales, PA-C  HYDROcodone-acetaminophen (NORCO/VICODIN) 5-325 MG per tablet Take 1 tablet by mouth every 6 (six) hours as needed for severe pain. 03/11/13   Jeryl Columbia, NP    ALLERGIES:  No Known Allergies  SOCIAL HISTORY:  History  Substance Use Topics  . Smoking status: Never Smoker   . Smokeless tobacco: Not on file  . Alcohol Use: No    FAMILY HISTORY: No family history on file.  EXAM: BP 125/62 mmHg  Pulse 61  Temp(Src) 98.3 F (36.8 C) (Oral)   Resp 21  SpO2 99% CONSTITUTIONAL: Alert and oriented and responds appropriately to questions. Well-appearing; well-nourished HEAD: Normocephalic EYES: Conjunctivae clear, PERRL ENT: normal nose; no rhinorrhea; moist mucous membranes; pharynx without lesions noted NECK: Supple, no meningismus, no LAD  CARD: RRR; S1 and S2 appreciated; no murmurs, no clicks, no rubs, no gallops RESP: Normal chest excursion without splinting or tachypnea; breath sounds clear and equal bilaterally; no wheezes, no rhonchi, no rales, no hypoxia or respiratory distress ABD/GI: Normal bowel sounds; non-distended; soft, non-tender, no rebound, no guarding BACK:  The back appears normal and is non-tender to palpation, there is no CVA tenderness EXT: Normal ROM in all joints; non-tender to palpation; no edema; normal capillary refill; no cyanosis, no calf tenderness or swelling    SKIN: Normal color for age and race; warm NEURO: Moves all extremities equally PSYCH: The patient's mood and manner are appropriate. Grooming and personal hygiene are appropriate.  MEDICAL DECISION MAKING: Patient here with complaints of chest pain. She will have intermittent episodes of bradycardia that never last more than several seconds before resolving. She was seen by cardiology and was found to have second-degree heart block and was deemed not to be a pacemaker candidate. Discussed this with family who agree that at this time I do not feel they would want a pacemaker placed for the patient. He was also decided that they would likely not  want the patient to have a stress test or cardiac catheterization. It is unclear why they continue to return to the emergency department. They would like another cardiac workup in the ED today. Will obtain chest x-ray, troponin. Patient is pain-free currently.  ED PROGRESS: Patient has 1 negative troponin, clear chest x-ray. Patient's granddaughter is now at bedside. Appears that whenever patient has episodes  of pain the family becomes. He scared and they are not sure what to do. Discussed with family that I do not feel that she needs to be admitted again to the hospital given the family would not intervene any further on her symptoms. It appears they do have outpatient cardiology follow-up scheduled on 12/24. We'll repeat a second troponin and a chest CT given her history of pulmonary embolus. Family reports this would make them feel much more comfortable. If this workup is unremarkable, they are comfortable with the plan for discharge home. Advised him to follow-up with her primary care physician.   Patient's second troponin is negative. Her CT scan shows no dissection or pulmonary embolus. She does have a 3.8 cm mildly dilated ascending thoracic aortic aneurysm. Discussed with patient's family that she can have this followed with her PCP. We'll give vascular surgery follow-up information as this can be followed as an outpatient. I have low suspicion that this is the cause of her pain or that this needs any emergent follow-up. She is still pain-free. She has cardiology follow-up scheduled. Have discussed with family that they need to come up with a care plan for the patient as it seems that multiple family members may not be comfortable with not intervening on patient's symptoms. They have not yet decided if patient would be a DO NOT RESUSCITATE/DO NOT INTUBATE. Have advised them to talk to their PCP further about this. Social work and case management have also talked to family to help with outpatient resources.     EKG Interpretation  Date/Time:  Monday December 25 2013 17:31:22 EST Ventricular Rate:  67 PR Interval:  124 QRS Duration: 74 QT Interval:  399 QTC Calculation: 421 R Axis:   42 Text Interpretation:  Sinus rhythm Multiform ventricular premature complexes Low voltage, precordial leads Confirmed by WARD,  DO, KRISTEN ([id number]) on 12/25/2013 6:19:02 PM        [place], DO 12/26/13  0347

## 2013-12-26 ENCOUNTER — Encounter: Payer: Self-pay | Admitting: *Deleted

## 2013-12-26 NOTE — Progress Notes (Signed)
Mayetta Castleman J. Clydene Laming, RN, BSN, [place] [phone] Laurena Slimmer, RN, BSN, NCM spoke with pt and family at bedside regarding discharge planning for [facility]. Offered pt list of home health agencies to choose from.  Pt chose [facility] to render services.  Fuller Mandril, RN, BSN, NCM called Stanton Kidney, RN of [facility] to notify her of pt choice.  No DME needs identified at this time.

## 2014-01-04 ENCOUNTER — Encounter: Payer: Medicare Other | Admitting: Physician Assistant

## 2014-01-19 ENCOUNTER — Encounter: Payer: Medicare Other | Admitting: Physician Assistant

## 2014-02-07 ENCOUNTER — Ambulatory Visit (INDEPENDENT_AMBULATORY_CARE_PROVIDER_SITE_OTHER): Payer: Medicare Other | Admitting: Physician Assistant

## 2014-02-07 ENCOUNTER — Encounter: Payer: Self-pay | Admitting: Physician Assistant

## 2014-02-07 VITALS — BP 100/68 | HR 67 | Ht <= 58 in | Wt 75.0 lb

## 2014-02-07 DIAGNOSIS — I480 Paroxysmal atrial fibrillation: Secondary | ICD-10-CM

## 2014-02-07 DIAGNOSIS — I495 Sick sinus syndrome: Secondary | ICD-10-CM

## 2014-02-07 DIAGNOSIS — R079 Chest pain, unspecified: Secondary | ICD-10-CM

## 2014-02-07 NOTE — Progress Notes (Signed)
Cardiology Office Note   Date:  02/07/2014   ID:  Roberta White, DOB 12-28-1917, MRN [id number]  PCP:  Roberta Fendt, MD  Cardiologist:  Dr. Sherren Mocha    Chief Complaint  Patient presents with  . Hospitalization Follow-up    Admitted with chest pain  . Atrial Fibrillation  . 2nd Degree AVB Type 1     History of Present Illness: Roberta White is a 81 y.o. female who presents for FU on the above.    She has a hx of dementia, DVT, pulmonary embolism, DM2.    Admitted 12/5-12/7 with chest pain.  Cardiac enzymes remained normal. Patient had atrial fibrillation with RVR, sinus bradycardia and second-degree AV block type 1 noted on telemetry. She is a DO NOT RESUSCITATE. She was not felt to be a candidate for permanent pacemaker, stress testing or anticoagulation. Conservative therapy was continued.  She was seen be in the ED x 2 in Dec 2015, after her DC, for chest pain.    She is here with her daughter.  She is pleasant.  .  she denies chest pain, shortness of breath, syncope, orthopnea, PND or significant pedal edema.   Studies/Reports Reviewed Today:  Chest CTA (12/25/13):  IMPRESSION:  No evidence of pulmonary embolus. Mildly dilated ascending thoracic aorta is noted (3.8 cm).   Past Medical History  Diagnosis Date  . DVT (deep venous thrombosis)   . Pulmonary embolism   . Dementia   . Diabetes mellitus without complication   . Dementia     Past Surgical History  Procedure Laterality Date  . Cataract extraction w/phaco  08/12/2011    Procedure: CATARACT EXTRACTION PHACO AND INTRAOCULAR LENS PLACEMENT (IOC);  Surgeon: Adonis Brook, MD;  Location: [facility];  Service: Ophthalmology;  Laterality: Left;     Current Outpatient Prescriptions  Medication Sig Dispense Refill  . aspirin EC 81 MG EC tablet Take 1 tablet (81 mg total) by mouth daily.    Marland Kitchen HYDROcodone-acetaminophen (NORCO/VICODIN) 5-325 MG per tablet Take 1 tablet by mouth every 6 (six) hours as needed for severe  pain. 10 tablet 0   No current facility-administered medications for this visit.    Allergies:   Review of patient's allergies indicates no known allergies.    Social History:  The patient  reports that she has never smoked. She does not have any smokeless tobacco history on file. She reports that she does not drink alcohol or use illicit drugs.   Family History:  The patient's family history is negative for Heart attack and Heart failure.    ROS:  Please see the history of present illness.   Otherwise, review of systems are positive for occasional BRBPR (chronic).   All other systems are reviewed and negative.    PHYSICAL EXAM: VS:  BP 100/68 mmHg  Pulse 67  Ht 4\' 4"  (1.321 m)  Wt 75 lb (34.02 kg)  BMI 19.50 kg/m2    Wt Readings from Last 3 Encounters:  02/07/14 75 lb (34.02 kg)  12/17/13 82 lb 11.2 oz (37.512 kg)  08/12/11 67 lb 7.4 oz (30.6 kg)     GEN: Well nourished, well developed, in no acute distress HEENT: normal Neck: no JVD, no masses Cardiac:  Normal S1/S2, RRR; no murmur, no rubs or gallops, no edema  Respiratory:  clear to auscultation bilaterally, no wheezing, rhonchi or rales. GI: soft, nontender, nondistended, + BS MS: no deformity or atrophy Skin: warm and dry  Neuro:  CNs II-XII intact, Strength  and sensation are intact Psych: Normal affect   EKG:  EKG is ordered today.  It demonstrates:   Undetermined rhythm, HR 67, PVCs (AFib vs 2nd degree AVB type 1)   Recent Labs: 12/16/2013: Pro B Natriuretic peptide (BNP) 781.1* 12/25/2013: ALT 8; BUN 13; Creatinine 0.81; Hemoglobin 11.1*; Platelets 294; Potassium 4.7; Sodium 138    Lipid Panel No results found for: CHOL, TRIG, HDL, CHOLHDL, VLDL, LDLCALC, LDLDIRECT    ASSESSMENT AND PLAN:  1.  Chest Pain:  No recurrence.  Given her advanced age and dementia, we would avoid aggressive invasive workup and pursue conservative management.  Continue ASA. 2.  Paroxysmal Atrial Fibrillation:  She is not a  candidate for anticoagulation.  Continue ASA. 3.  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  Given her advanced age and dementia, would try to avoid pacemaker implantation as much as possible.   Current medicines are reviewed at length with the patient today.  The patient does not have concerns regarding medicines.  The following changes have been made:  no change  Labs/ tests ordered today include:   Orders Placed This Encounter  Procedures  . EKG 12-Lead     Disposition:   FU with me in 6 months.   Signed, Versie Starks, MHS 02/07/2014 4:38 PM    [facility] [address], [place], [place]  [address] Phone: [phone]; Fax: [phone]

## 2014-02-07 NOTE — Patient Instructions (Signed)
Your physician wants you to follow-up in: 6 months with SCOTT WEAVER, PAC SAME DAY DR. Burt Knack IS IN THE OFFICE. You will receive a reminder letter in the mail two months in advance. If you don't receive a letter, please call our office to schedule the follow-up appointment.   Your physician recommends that you continue on your current medications as directed. Please refer to the Current Medication list given to you today.

## 2014-06-27 ENCOUNTER — Encounter (HOSPITAL_COMMUNITY): Payer: Self-pay | Admitting: Emergency Medicine

## 2014-06-27 ENCOUNTER — Emergency Department (INDEPENDENT_AMBULATORY_CARE_PROVIDER_SITE_OTHER)
Admission: EM | Admit: 2014-06-27 | Discharge: 2014-06-27 | Disposition: A | Discharge status: Home / Self Care | Payer: Medicare Other | Source: Home / Self Care | Attending: Emergency Medicine | Admitting: Emergency Medicine

## 2014-06-27 DIAGNOSIS — K2970 Gastritis, unspecified, without bleeding: Secondary | ICD-10-CM

## 2014-06-27 MED ORDER — SUCRALFATE 1 G PO TABS: 1.0000 g | ORAL_TABLET | Freq: Three times a day (TID) | ORAL | Status: DC

## 2014-06-27 NOTE — ED Notes (Signed)
C/o abdominal pain since 6/13.  States one vomiting episode on Monday.  Denies fever and diarrhea.  No otc treatments tried.

## 2014-06-27 NOTE — Discharge Instructions (Signed)
I think she has some irritation of her stomach lining. Give her sucralfate 4 times a day. Please make an appointment for her to see her regular doctor in 1-2 weeks for a recheck. If she develops fevers, vomiting, the abdominal pain is getting worse, or she has bloody stools, please go to the emergency room.

## 2014-06-27 NOTE — ED Provider Notes (Signed)
CSN: [id number]     Arrival date & time 06/27/14  1847 History   First MD Initiated Contact with Patient 06/27/14 1930     Chief Complaint  Patient presents with  . Abdominal Pain   (Consider location/radiation/quality/duration/timing/severity/associated sxs/prior Treatment) HPI She is a 81 year old woman here with her daughter for evaluation of stomach pains. History is obtained from the daughter as the patient has dementia. Daughter states that she had an episode of vomiting on Monday that was associated with stomach pain. Daughter states she has continued to have intermittent stomach pains. These occur after eating. They last for 5-6 minutes and resolve on their own. She has not had any additional vomiting. No diarrhea. No fevers or chills. Patient denies any pain currently. Daughter states she is drinking fluids well, but her appetite for solid food is decreased today.  Last bowel movement was this morning.  Past Medical History  Diagnosis Date  . DVT (deep venous thrombosis)   . Pulmonary embolism   . Dementia   . Diabetes mellitus without complication   . Dementia    Past Surgical History  Procedure Laterality Date  . Cataract extraction w/phaco  08/12/2011    Procedure: CATARACT EXTRACTION PHACO AND INTRAOCULAR LENS PLACEMENT (IOC);  Surgeon: Adonis Brook, MD;  Location: [facility];  Service: Ophthalmology;  Laterality: Left;   Family History  Problem Relation Age of Onset  . Heart attack Neg Hx   . Heart failure Neg Hx    History  Substance Use Topics  . Smoking status: Never Smoker   . Smokeless tobacco: Not on file  . Alcohol Use: No   OB History    No data available     Review of Systems Deferred due to dementia Allergies  Review of patient's allergies indicates no known allergies.  Home Medications   Prior to Admission medications   Medication Sig Start Date End Date Taking? Authorizing Provider  aspirin EC 81 MG EC tablet Take 1 tablet (81 mg total) by mouth  daily. 12/18/13   Brett Canales, PA-C  HYDROcodone-acetaminophen (NORCO/VICODIN) 5-325 MG per tablet Take 1 tablet by mouth every 6 (six) hours as needed for severe pain. 03/11/13   Rhetta Mura Schorr, NP  sucralfate (CARAFATE) 1 G tablet Take 1 tablet (1 g total) by mouth 4 (four) times daily -  with meals and at bedtime. For 2 weeks 06/27/14   Melony Overly, MD   BP 95/56 mmHg  Pulse 78  Temp(Src) 96.9 F (36.1 C)  Resp 12  SpO2 98% Physical Exam  Constitutional: She appears well-developed. No distress.  Cardiovascular: Normal rate and normal heart sounds.   No murmur heard. Irregularly irregular  Pulmonary/Chest: Effort normal and breath sounds normal. No respiratory distress. She has no wheezes. She has no rales.  Abdominal: Soft. Bowel sounds are normal. She exhibits no distension. There is no tenderness. There is no rebound and no guarding.  Neurological: She is alert.  Skin: She is not diaphoretic.    ED Course  Procedures (including critical care time) Labs Review Labs Reviewed - No data to display  Imaging Review No results found.   MDM   1. Gastritis    We'll treat with sucralfate. Return precautions reviewed. Follow-up with PCP in 1-2 weeks.    Melony Overly, MD 06/27/14 2038

## 2014-07-17 ENCOUNTER — Emergency Department (HOSPITAL_COMMUNITY): Payer: Medicare Other

## 2014-07-17 ENCOUNTER — Inpatient Hospital Stay (HOSPITAL_COMMUNITY)
Admission: EM | Admit: 2014-07-17 | Discharge: 2014-07-22 | DRG: 374 | Disposition: A | Discharge status: Hospice | Payer: Medicare Other | Attending: Internal Medicine | Admitting: Internal Medicine

## 2014-07-17 ENCOUNTER — Encounter (HOSPITAL_COMMUNITY): Payer: Self-pay | Admitting: Emergency Medicine

## 2014-07-17 DIAGNOSIS — E876 Hypokalemia: Secondary | ICD-10-CM | POA: Diagnosis present

## 2014-07-17 DIAGNOSIS — R109 Unspecified abdominal pain: Secondary | ICD-10-CM

## 2014-07-17 DIAGNOSIS — F0390 Unspecified dementia without behavioral disturbance: Secondary | ICD-10-CM | POA: Diagnosis present

## 2014-07-17 DIAGNOSIS — E119 Type 2 diabetes mellitus without complications: Secondary | ICD-10-CM | POA: Diagnosis present

## 2014-07-17 DIAGNOSIS — C187 Malignant neoplasm of sigmoid colon: Principal | ICD-10-CM | POA: Diagnosis present

## 2014-07-17 DIAGNOSIS — Z86711 Personal history of pulmonary embolism: Secondary | ICD-10-CM

## 2014-07-17 DIAGNOSIS — Z7982 Long term (current) use of aspirin: Secondary | ICD-10-CM

## 2014-07-17 DIAGNOSIS — E46 Unspecified protein-calorie malnutrition: Secondary | ICD-10-CM | POA: Diagnosis present

## 2014-07-17 DIAGNOSIS — I10 Essential (primary) hypertension: Secondary | ICD-10-CM | POA: Diagnosis present

## 2014-07-17 DIAGNOSIS — R531 Weakness: Secondary | ICD-10-CM | POA: Diagnosis not present

## 2014-07-17 DIAGNOSIS — I480 Paroxysmal atrial fibrillation: Secondary | ICD-10-CM | POA: Diagnosis present

## 2014-07-17 DIAGNOSIS — Z66 Do not resuscitate: Secondary | ICD-10-CM | POA: Diagnosis present

## 2014-07-17 DIAGNOSIS — Z515 Encounter for palliative care: Secondary | ICD-10-CM

## 2014-07-17 DIAGNOSIS — R627 Adult failure to thrive: Secondary | ICD-10-CM | POA: Diagnosis present

## 2014-07-17 DIAGNOSIS — C189 Malignant neoplasm of colon, unspecified: Secondary | ICD-10-CM

## 2014-07-17 DIAGNOSIS — E43 Unspecified severe protein-calorie malnutrition: Secondary | ICD-10-CM | POA: Diagnosis present

## 2014-07-17 DIAGNOSIS — R634 Abnormal weight loss: Secondary | ICD-10-CM | POA: Diagnosis present

## 2014-07-17 LAB — CBC WITH DIFFERENTIAL/PLATELET
BASOS ABS: 0 10*3/uL (ref 0.0–0.1)
Basophils Relative: 0 % (ref 0–1)
EOS ABS: 0 10*3/uL (ref 0.0–0.7)
EOS PCT: 0 % (ref 0–5)
HCT: 30.4 % — ABNORMAL LOW (ref 36.0–46.0)
Hemoglobin: 9.9 g/dL — ABNORMAL LOW (ref 12.0–15.0)
Lymphocytes Relative: 17 % (ref 12–46)
Lymphs Abs: 1 10*3/uL (ref 0.7–4.0)
MCH: 23 pg — AB (ref 26.0–34.0)
MCHC: 32.6 g/dL (ref 30.0–36.0)
MCV: 70.7 fL — AB (ref 78.0–100.0)
Monocytes Absolute: 0.5 10*3/uL (ref 0.1–1.0)
Monocytes Relative: 9 % (ref 3–12)
NEUTROS PCT: 74 % (ref 43–77)
Neutro Abs: 4.3 10*3/uL (ref 1.7–7.7)
PLATELETS: 331 10*3/uL (ref 150–400)
RBC: 4.3 MIL/uL (ref 3.87–5.11)
RDW: 17.1 % — AB (ref 11.5–15.5)
WBC: 5.9 10*3/uL (ref 4.0–10.5)

## 2014-07-17 LAB — LIPASE, BLOOD: LIPASE: 14 U/L — AB (ref 22–51)

## 2014-07-17 LAB — URINALYSIS, ROUTINE W REFLEX MICROSCOPIC
BILIRUBIN URINE: NEGATIVE
GLUCOSE, UA: NEGATIVE mg/dL
HGB URINE DIPSTICK: NEGATIVE
Ketones, ur: NEGATIVE mg/dL
Leukocytes, UA: NEGATIVE
Nitrite: NEGATIVE
Protein, ur: NEGATIVE mg/dL
Specific Gravity, Urine: 1.017 (ref 1.005–1.030)
UROBILINOGEN UA: 1 mg/dL (ref 0.0–1.0)
pH: 5.5 (ref 5.0–8.0)

## 2014-07-17 LAB — COMPREHENSIVE METABOLIC PANEL
ALT: 7 U/L — ABNORMAL LOW (ref 14–54)
AST: 23 U/L (ref 15–41)
Albumin: 3.3 g/dL — ABNORMAL LOW (ref 3.5–5.0)
Alkaline Phosphatase: 68 U/L (ref 38–126)
Anion gap: 10 (ref 5–15)
BUN: 14 mg/dL (ref 6–20)
CO2: 24 mmol/L (ref 22–32)
CREATININE: 0.84 mg/dL (ref 0.44–1.00)
Calcium: 9.3 mg/dL (ref 8.9–10.3)
Chloride: 107 mmol/L (ref 101–111)
GFR, EST NON AFRICAN AMERICAN: 57 mL/min — AB (ref >60)
Glucose, Bld: 183 mg/dL — ABNORMAL HIGH (ref 65–99)
Potassium: 3.5 mmol/L (ref 3.5–5.1)
Sodium: 141 mmol/L (ref 135–145)
Total Bilirubin: 0.5 mg/dL (ref 0.3–1.2)
Total Protein: 7.7 g/dL (ref 6.5–8.1)

## 2014-07-17 LAB — TROPONIN I: Troponin I: <0.03 ng/mL (ref <0.031)

## 2014-07-17 MED ORDER — IOHEXOL 300 MG/ML  SOLN
100.0000 mL | Freq: Once | INTRAMUSCULAR | Status: AC | PRN
  Administered 2014-07-17: 70 mL via INTRAVENOUS

## 2014-07-17 MED ORDER — SODIUM CHLORIDE 0.9 % IV BOLUS (SEPSIS)
1000.0000 mL | Freq: Once | INTRAVENOUS | Status: AC
  Administered 2014-07-17: 1000 mL via INTRAVENOUS

## 2014-07-17 MED ORDER — IOHEXOL 300 MG/ML  SOLN
25.0000 mL | Freq: Once | INTRAMUSCULAR | Status: AC | PRN
  Administered 2014-07-17: 25 mL via ORAL

## 2014-07-17 NOTE — ED Notes (Signed)
Pt in xray.  Nurse to get labs

## 2014-07-17 NOTE — ED Notes (Signed)
Per EMS-called out for failure to thrive. Hx dementia. From home-decreased energy and PO intake. VS: BP 110/60 HR 90 RR 14 CBG 154 mg/dl. Alert to self, answers yes/no questions.

## 2014-07-17 NOTE — ED Notes (Signed)
Bed: QD64 Expected date:  Expected time:  Means of arrival:  Comments: EMS 95 from home

## 2014-07-18 DIAGNOSIS — Z66 Do not resuscitate: Secondary | ICD-10-CM | POA: Diagnosis present

## 2014-07-18 DIAGNOSIS — R627 Adult failure to thrive: Secondary | ICD-10-CM | POA: Diagnosis present

## 2014-07-18 DIAGNOSIS — I480 Paroxysmal atrial fibrillation: Secondary | ICD-10-CM

## 2014-07-18 DIAGNOSIS — E46 Unspecified protein-calorie malnutrition: Secondary | ICD-10-CM

## 2014-07-18 DIAGNOSIS — E876 Hypokalemia: Secondary | ICD-10-CM | POA: Diagnosis present

## 2014-07-18 DIAGNOSIS — R531 Weakness: Secondary | ICD-10-CM | POA: Diagnosis present

## 2014-07-18 DIAGNOSIS — I10 Essential (primary) hypertension: Secondary | ICD-10-CM | POA: Diagnosis present

## 2014-07-18 DIAGNOSIS — C189 Malignant neoplasm of colon, unspecified: Secondary | ICD-10-CM | POA: Diagnosis not present

## 2014-07-18 DIAGNOSIS — F0390 Unspecified dementia without behavioral disturbance: Secondary | ICD-10-CM | POA: Diagnosis present

## 2014-07-18 DIAGNOSIS — C187 Malignant neoplasm of sigmoid colon: Secondary | ICD-10-CM | POA: Diagnosis present

## 2014-07-18 DIAGNOSIS — Z515 Encounter for palliative care: Secondary | ICD-10-CM | POA: Diagnosis not present

## 2014-07-18 DIAGNOSIS — Z7982 Long term (current) use of aspirin: Secondary | ICD-10-CM | POA: Diagnosis not present

## 2014-07-18 DIAGNOSIS — E43 Unspecified severe protein-calorie malnutrition: Secondary | ICD-10-CM | POA: Diagnosis present

## 2014-07-18 DIAGNOSIS — R634 Abnormal weight loss: Secondary | ICD-10-CM | POA: Diagnosis not present

## 2014-07-18 DIAGNOSIS — Z86711 Personal history of pulmonary embolism: Secondary | ICD-10-CM | POA: Diagnosis not present

## 2014-07-18 DIAGNOSIS — E119 Type 2 diabetes mellitus without complications: Secondary | ICD-10-CM | POA: Diagnosis present

## 2014-07-18 LAB — CBC
HCT: 29.6 % — ABNORMAL LOW (ref 36.0–46.0)
Hemoglobin: 9.5 g/dL — ABNORMAL LOW (ref 12.0–15.0)
MCH: 22.4 pg — ABNORMAL LOW (ref 26.0–34.0)
MCHC: 32.1 g/dL (ref 30.0–36.0)
MCV: 69.8 fL — ABNORMAL LOW (ref 78.0–100.0)
Platelets: 348 10*3/uL (ref 150–400)
RBC: 4.24 MIL/uL (ref 3.87–5.11)
RDW: 17 % — AB (ref 11.5–15.5)
WBC: 5.3 10*3/uL (ref 4.0–10.5)

## 2014-07-18 LAB — COMPREHENSIVE METABOLIC PANEL
ALBUMIN: 3.1 g/dL — AB (ref 3.5–5.0)
ALT: 10 U/L — ABNORMAL LOW (ref 14–54)
ANION GAP: 8 (ref 5–15)
AST: 21 U/L (ref 15–41)
Alkaline Phosphatase: 66 U/L (ref 38–126)
BILIRUBIN TOTAL: 0.7 mg/dL (ref 0.3–1.2)
BUN: 10 mg/dL (ref 6–20)
CHLORIDE: 109 mmol/L (ref 101–111)
CO2: 23 mmol/L (ref 22–32)
CREATININE: 0.73 mg/dL (ref 0.44–1.00)
Calcium: 8.9 mg/dL (ref 8.9–10.3)
GFR calc Af Amer: >60 mL/min (ref >60)
GFR calc non Af Amer: >60 mL/min (ref >60)
Glucose, Bld: 145 mg/dL — ABNORMAL HIGH (ref 65–99)
Potassium: 3.1 mmol/L — ABNORMAL LOW (ref 3.5–5.1)
Sodium: 140 mmol/L (ref 135–145)
Total Protein: 7.3 g/dL (ref 6.5–8.1)

## 2014-07-18 MED ORDER — FAMOTIDINE 20 MG PO TABS
20.0000 mg | ORAL_TABLET | Freq: Every day | ORAL | Status: DC
  Administered 2014-07-18 – 2014-07-19 (×2): 20 mg via ORAL
  Filled 2014-07-18 (×4): qty 1

## 2014-07-18 MED ORDER — ACETAMINOPHEN 325 MG PO TABS: 650.0000 mg | ORAL_TABLET | Freq: Four times a day (QID) | ORAL | Status: DC | PRN

## 2014-07-18 MED ORDER — SODIUM CHLORIDE 0.9 % IV SOLN
INTRAVENOUS | Status: DC
  Administered 2014-07-18: 02:00:00 via INTRAVENOUS

## 2014-07-18 MED ORDER — DOCUSATE SODIUM 100 MG PO CAPS
100.0000 mg | ORAL_CAPSULE | Freq: Two times a day (BID) | ORAL | Status: DC
  Administered 2014-07-18 – 2014-07-19 (×3): 100 mg via ORAL
  Filled 2014-07-18 (×8): qty 1

## 2014-07-18 MED ORDER — ASPIRIN EC 81 MG PO TBEC
81.0000 mg | DELAYED_RELEASE_TABLET | Freq: Every day | ORAL | Status: DC
  Administered 2014-07-18 – 2014-07-19 (×2): 81 mg via ORAL
  Filled 2014-07-18 (×4): qty 1

## 2014-07-18 MED ORDER — HEPARIN SODIUM (PORCINE) 5000 UNIT/ML IJ SOLN
5000.0000 [IU] | Freq: Three times a day (TID) | INTRAMUSCULAR | Status: DC
  Administered 2014-07-18: 5000 [IU] via SUBCUTANEOUS
  Filled 2014-07-18 (×5): qty 1

## 2014-07-18 MED ORDER — ONDANSETRON HCL 4 MG/2ML IJ SOLN
4.0000 mg | Freq: Four times a day (QID) | INTRAMUSCULAR | Status: DC | PRN
  Administered 2014-07-18: 4 mg via INTRAVENOUS
  Filled 2014-07-18: qty 2

## 2014-07-18 MED ORDER — POLYETHYLENE GLYCOL 3350 17 G PO PACK: 17.0000 g | PACK | Freq: Every day | ORAL | Status: DC | PRN

## 2014-07-18 MED ORDER — OXYCODONE HCL 5 MG PO TABS
5.0000 mg | ORAL_TABLET | ORAL | Status: DC | PRN
  Administered 2014-07-19: 5 mg via ORAL
  Filled 2014-07-18 (×2): qty 1

## 2014-07-18 MED ORDER — ACETAMINOPHEN 650 MG RE SUPP: 650.0000 mg | Freq: Four times a day (QID) | RECTAL | Status: DC | PRN

## 2014-07-18 MED ORDER — ONDANSETRON HCL 4 MG PO TABS: 4.0000 mg | ORAL_TABLET | Freq: Four times a day (QID) | ORAL | Status: DC | PRN

## 2014-07-18 MED ORDER — POTASSIUM CHLORIDE 2 MEQ/ML IV SOLN
INTRAVENOUS | Status: DC
  Administered 2014-07-18 – 2014-07-22 (×5): via INTRAVENOUS
  Filled 2014-07-18 (×10): qty 1000

## 2014-07-18 MED ORDER — SUCRALFATE 1 G PO TABS
1.0000 g | ORAL_TABLET | Freq: Three times a day (TID) | ORAL | Status: DC
  Administered 2014-07-18 (×2): 1 g via ORAL
  Filled 2014-07-18 (×5): qty 1

## 2014-07-18 NOTE — ED Notes (Signed)
Hopsitalist at bedside.

## 2014-07-18 NOTE — ED Provider Notes (Signed)
CSN: [id number]     Arrival date & time 07/17/14  1806 History   First MD Initiated Contact with Patient 07/17/14 1839     Chief Complaint  Patient presents with  . Failure To Thrive     (Consider location/radiation/quality/duration/timing/severity/associated sxs/prior Treatment) HPI Comments: LEVEL 5 CAVEAT FOR SEVERE DEMENTIA.  Pt brought in by the daughter for weakness. Pt has dementia, PE not on anticoagulants, and she lives at home with her daughter with day time health aide helping. Per daughter, her mother has been having some stomach complains intermittently for the last few days, and she has noticed decreased po intake, increased weakness. Pt has had intermittent emesis as well. Stool have occasional blood mixed in them. There is no trauma/falls. Pt has not had any fevers.   The history is provided by the patient.    Past Medical History  Diagnosis Date  . DVT (deep venous thrombosis)   . Pulmonary embolism   . Dementia   . Diabetes mellitus without complication   . Dementia    Past Surgical History  Procedure Laterality Date  . Cataract extraction w/phaco  08/12/2011    Procedure: CATARACT EXTRACTION PHACO AND INTRAOCULAR LENS PLACEMENT (IOC);  Surgeon: Adonis Brook, MD;  Location: [facility];  Service: Ophthalmology;  Laterality: Left;   Family History  Problem Relation Age of Onset  . Heart attack Neg Hx   . Heart failure Neg Hx    History  Substance Use Topics  . Smoking status: Never Smoker   . Smokeless tobacco: Not on file  . Alcohol Use: No   OB History    No data available     Review of Systems  Unable to perform ROS: Dementia      Allergies  Review of patient's allergies indicates no known allergies.  Home Medications   Prior to Admission medications   Medication Sig Start Date End Date Taking? Authorizing Provider  aspirin EC 81 MG EC tablet Take 1 tablet (81 mg total) by mouth daily. 12/18/13  Yes Brett Canales, PA-C  sucralfate (CARAFATE) 1 G  tablet Take 1 tablet (1 g total) by mouth 4 (four) times daily -  with meals and at bedtime. For 2 weeks 06/27/14  Yes Melony Overly, MD   BP 169/85 mmHg  Pulse 86  Temp(Src) 97.8 F (36.6 C) (Oral)  Resp 23  SpO2 99% Physical Exam  Constitutional: She is oriented to person, place, and time. She appears well-developed and well-nourished.  HENT:  Head: Normocephalic and atraumatic.  Eyes: EOM are normal. Pupils are equal, round, and reactive to light.  Neck: Neck supple.  Cardiovascular: Normal rate, regular rhythm and normal heart sounds.   No murmur heard. Pulmonary/Chest: Effort normal. No respiratory distress.  Abdominal: Soft. She exhibits no distension. There is tenderness. There is no rebound and no guarding.  Lower quadrant tenderness  Neurological: She is alert and oriented to person, place, and time.  Skin: Skin is warm and dry.  Nursing note and vitals reviewed.   ED Course  Procedures (including critical care time) Labs Review Labs Reviewed  CBC WITH DIFFERENTIAL/PLATELET - Abnormal; Notable for the following:    Hemoglobin 9.9 (*)    HCT 30.4 (*)    MCV 70.7 (*)    MCH 23.0 (*)    RDW 17.1 (*)    All other components within normal limits  COMPREHENSIVE METABOLIC PANEL - Abnormal; Notable for the following:    Glucose, Bld 183 (*)  Albumin 3.3 (*)    ALT 7 (*)    GFR calc non Af Amer 57 (*)    All other components within normal limits  LIPASE, BLOOD - Abnormal; Notable for the following:    Lipase 14 (*)    All other components within normal limits  URINE CULTURE  TROPONIN I  URINALYSIS, ROUTINE W REFLEX MICROSCOPIC (NOT AT [facility])    Imaging Review Ct Abdomen Pelvis W Contrast  07/17/2014   CLINICAL DATA:  Acute onset of failure to thrive, with decreased oral intake. Initial encounter.  EXAM: CT ABDOMEN AND PELVIS WITH CONTRAST  TECHNIQUE: Multidetector CT imaging of the abdomen and pelvis was performed using the standard protocol following bolus  administration of intravenous contrast.  CONTRAST:  38mL OMNIPAQUE IOHEXOL 300 MG/ML  SOLN  COMPARISON:  Abdominal radiograph performed earlier today at 7:34 p.m.  FINDINGS: Bibasilar atelectasis is noted.  The liver and spleen are unremarkable in appearance. The gallbladder is within normal limits. The pancreas and adrenal glands are unremarkable.  Chronic bilateral renal atrophy and scarring is noted, with scattered parenchymal calcifications seen bilaterally. Bilateral renal cysts measure up to 4.0 cm in size. There is no evidence of hydronephrosis. No obstructing ureteral stones are seen. No perinephric stranding is appreciated.  No free fluid is identified. The distal small bowel is filled with fluid and air, though not significantly distended. The more proximal small bowel is unremarkable in appearance. The stomach is within normal limits. No acute vascular abnormalities are seen. Scattered calcification is noted along the abdominal aorta and its branches, including at the origins of the renal arteries and superior mesenteric artery.  The appendix is normal in caliber, without evidence for appendicitis.  The colon is diffusely filled with fluid and air, without significant dilatation. This extends to the level of a circumferential mass at the proximal sigmoid colon, measuring approximately 4.3 cm in length. This is compatible with primary colonic malignancy.  Multiple adjacent small nodes are seen within the mesentery, measuring up to 5 mm, likely reflecting local spread of disease.  The bladder is mildly distended and grossly unremarkable in appearance. The uterus is grossly unremarkable. The right ovary is unremarkable in appearance. No suspicious adnexal masses are seen. No inguinal lymphadenopathy is seen.  No acute osseous abnormalities are identified.  IMPRESSION: 1. Circumferential mass noted at the proximal sigmoid colon, measuring 4.3 cm in length, compatible with primary colonic malignancy.  Colonoscopy and biopsy would be helpful for further evaluation, as deemed clinically appropriate. 2. Multiple adjacent small nodes within the mesentery, measuring up to 5 mm, likely reflecting local spread of disease. 3. The more proximal colon and distal small bowel are filled with fluid and air, without significant dilatation. The colonic mass is causing some degree of bowel dysmotility. 4. Chronic bilateral renal atrophy and scarring, with scattered parenchymal calcifications and bilateral renal cysts. 5. Bibasilar atelectasis noted. 6. Scattered calcification along the abdominal aorta and its branches, including at the origins of the renal arteries and superior mesenteric artery.  These results were called by telephone at the time of interpretation on 07/17/2014 at 11:53 pm to Dr. Varney Biles, who verbally acknowledged these results.   Electronically Signed   By: Garald Balding M.D.   On: 07/17/2014 23:53   Dg Abd Acute W/chest  07/17/2014   CLINICAL DATA:  81 year old female with abdominal pain.  EXAM: DG ABDOMEN ACUTE W/ 1V CHEST  COMPARISON:  None.  FINDINGS: There is emphysematous changes of the lungs. No  focal consolidation, pleural effusion, or pneumothorax. The cardiomediastinal silhouette is within normal limits.  No intra-abdominal free air. There is diffuse air distention of the colon. Top-normal caliber air-filled loop of small bowel noted in the right hemiabdomen. CT may provide better evaluation if clinically indicated. For multilevel degenerative changes of the spine. No acute fracture.  IMPRESSION: Diffuse air distention of the colon without definite evidence of small bowel obstruction.   Electronically Signed   By: Anner Crete M.D.   On: 07/17/2014 19:46     EKG Interpretation   Date/Time:  Tuesday July 17 2014 19:41:29 EDT Ventricular Rate:  61 PR Interval:    QRS Duration: 78 QT Interval:  411 QTC Calculation: 414 R Axis:   59 Text Interpretation:  Atrial fibrillation No  acute changes Confirmed by  Kathrynn Humble, MD, Thelma Comp [phone]) on 07/17/2014 9:10:24 PM      MDM   Final diagnoses:  Abdominal pain  Colon cancer    Pt comes in with weakness. She has been having some GI complains - aas ordered and there were some air fluid levels, and thus CT was undertaken. Ct scan shows a colon mass concerning for malignancy.  Pt has no living will, code status discussion, or goals of care. Daughter doesn't have POA, but she is the primary care giver, with her 2 brothers not really in touch with the mother.  Results discussed with the daughter, and although she couldn't decide on goals of care and code status, we have initiated conversation that can lead to a proper conclusion soon.  Palliative care has been consulted - Dr. Hilma Favors will see the patient tomorrow morning. GI has been consulted, Dr. Amedeo Plenty will see the patient.   Varney Biles, MD 07/18/14 [phone]

## 2014-07-18 NOTE — H&P (Signed)
[facility] History and Physical  Patient: Roberta White  MRN: [id number]  DOB: 1917-06-04  DOS: the patient was seen and examined on 07/18/2014 PCP: Philis Fendt, MD  Referring physician: Dr. Kathrynn Humble Chief Complaint: Abdominal pain  HPI: Roberta White is a 81 y.o. female with Past medical history of dementia, A. fib. The patient is presenting with complaints of abdominal pain. The history was taken from her daughter who is the primary caregiver for the patient. As per the daughter the patient has been complaining of on and off stomach pain since last week but pain today was worse this was also associated with significant decrease in appetite as well as distention of her stomach. There was no fall no trauma no fever no chills no nausea no vomiting no diarrhea no constipation no active bleeding reported. She has chronic blood in her stool. Patient denies having any active pain at the time of my evaluation.  The patient is coming from home.  At her baseline ambulates with walker is dependent for most of her ADL does not manages her medication on her own.  Review of Systems: as mentioned in the history of present illness.  A comprehensive review of the other systems is negative.  Past Medical History  Diagnosis Date  . DVT (deep venous thrombosis)   . Pulmonary embolism   . Dementia   . Diabetes mellitus without complication   . Dementia    Past Surgical History  Procedure Laterality Date  . Cataract extraction w/phaco  08/12/2011    Procedure: CATARACT EXTRACTION PHACO AND INTRAOCULAR LENS PLACEMENT (IOC);  Surgeon: Adonis Brook, MD;  Location: [facility];  Service: Ophthalmology;  Laterality: Left;   Social History:  reports that she has never smoked. She does not have any smokeless tobacco history on file. She reports that she does not drink alcohol or use illicit drugs.  No Known Allergies  Family History  Problem Relation Age of Onset  . Heart attack Neg Hx   . Heart failure  Neg Hx     Prior to Admission medications   Medication Sig Start Date End Date Taking? Authorizing Provider  aspirin EC 81 MG EC tablet Take 1 tablet (81 mg total) by mouth daily. 12/18/13  Yes Brett Canales, PA-C  sucralfate (CARAFATE) 1 G tablet Take 1 tablet (1 g total) by mouth 4 (four) times daily -  with meals and at bedtime. For 2 weeks 06/27/14  Yes Melony Overly, MD    Physical Exam: Filed Vitals:   07/17/14 1941 07/17/14 2114 07/17/14 2335 07/18/14 0138  BP: 165/87 155/85 169/85 120/100  Pulse: 92 67 86 78  Temp:    97.5 F (36.4 C)  TempSrc:    Oral  Resp: 14 17 23    Weight:    36.741 kg (81 lb)  SpO2: 93% 90% 99% 99%    General: Alert, Awake and Oriented to Time, Place and Person. Appear in mild distress Eyes: PERRL ENT: Oral Mucosa clear moist. Neck: no JVD Cardiovascular: S1 and S2 Present, no Murmur, Peripheral Pulses Present Respiratory: Bilateral Air entry equal and Decreased,  Clear to Auscultation, no Crackles, no wheezes Abdomen: Bowel Sound present, Soft and non tender Skin: no Rash Extremities: no Pedal edema, no calf tenderness Neurologic: Grossly no focal neuro deficit.  Labs on Admission:  CBC:  Recent Labs Lab 07/17/14 1954 07/18/14 0420  WBC 5.9 5.3  NEUTROABS 4.3  --   HGB 9.9* 9.5*  HCT 30.4* 29.6*  MCV 70.7*  69.8*  PLT 331 348    CMP     Component Value Date/Time   NA 140 07/18/2014 0420   K 3.1* 07/18/2014 0420   CL 109 07/18/2014 0420   CO2 23 07/18/2014 0420   GLUCOSE 145* 07/18/2014 0420   BUN 10 07/18/2014 0420   CREATININE 0.73 07/18/2014 0420   CALCIUM 8.9 07/18/2014 0420   PROT 7.3 07/18/2014 0420   ALBUMIN 3.1* 07/18/2014 0420   AST 21 07/18/2014 0420   ALT 10* 07/18/2014 0420   ALKPHOS 66 07/18/2014 0420   BILITOT 0.7 07/18/2014 0420   GFRNONAA >60 07/18/2014 0420   GFRAA >60 07/18/2014 0420     Recent Labs Lab 07/17/14 1954  LIPASE 14*     Recent Labs Lab 07/17/14 1954  TROPONINI <0.03   BNP (last  3 results) No results for input(s): BNP in the last 8760 hours.  ProBNP (last 3 results)  Recent Labs  12/16/13 1705  PROBNP 781.1*     Radiological Exams on Admission: Ct Abdomen Pelvis W Contrast  07/17/2014   CLINICAL DATA:  Acute onset of failure to thrive, with decreased oral intake. Initial encounter.  EXAM: CT ABDOMEN AND PELVIS WITH CONTRAST  TECHNIQUE: Multidetector CT imaging of the abdomen and pelvis was performed using the standard protocol following bolus administration of intravenous contrast.  CONTRAST:  29mL OMNIPAQUE IOHEXOL 300 MG/ML  SOLN  COMPARISON:  Abdominal radiograph performed earlier today at 7:34 p.m.  FINDINGS: Bibasilar atelectasis is noted.  The liver and spleen are unremarkable in appearance. The gallbladder is within normal limits. The pancreas and adrenal glands are unremarkable.  Chronic bilateral renal atrophy and scarring is noted, with scattered parenchymal calcifications seen bilaterally. Bilateral renal cysts measure up to 4.0 cm in size. There is no evidence of hydronephrosis. No obstructing ureteral stones are seen. No perinephric stranding is appreciated.  No free fluid is identified. The distal small bowel is filled with fluid and air, though not significantly distended. The more proximal small bowel is unremarkable in appearance. The stomach is within normal limits. No acute vascular abnormalities are seen. Scattered calcification is noted along the abdominal aorta and its branches, including at the origins of the renal arteries and superior mesenteric artery.  The appendix is normal in caliber, without evidence for appendicitis.  The colon is diffusely filled with fluid and air, without significant dilatation. This extends to the level of a circumferential mass at the proximal sigmoid colon, measuring approximately 4.3 cm in length. This is compatible with primary colonic malignancy.  Multiple adjacent small nodes are seen within the mesentery, measuring up  to 5 mm, likely reflecting local spread of disease.  The bladder is mildly distended and grossly unremarkable in appearance. The uterus is grossly unremarkable. The right ovary is unremarkable in appearance. No suspicious adnexal masses are seen. No inguinal lymphadenopathy is seen.  No acute osseous abnormalities are identified.  IMPRESSION: 1. Circumferential mass noted at the proximal sigmoid colon, measuring 4.3 cm in length, compatible with primary colonic malignancy. Colonoscopy and biopsy would be helpful for further evaluation, as deemed clinically appropriate. 2. Multiple adjacent small nodes within the mesentery, measuring up to 5 mm, likely reflecting local spread of disease. 3. The more proximal colon and distal small bowel are filled with fluid and air, without significant dilatation. The colonic mass is causing some degree of bowel dysmotility. 4. Chronic bilateral renal atrophy and scarring, with scattered parenchymal calcifications and bilateral renal cysts. 5. Bibasilar atelectasis noted. 6. Scattered  calcification along the abdominal aorta and its branches, including at the origins of the renal arteries and superior mesenteric artery.  These results were called by telephone at the time of interpretation on 07/17/2014 at 11:53 pm to Dr. Varney Biles, who verbally acknowledged these results.   Electronically Signed   By: Garald Balding M.D.   On: 07/17/2014 23:53   Dg Abd Acute W/chest  07/17/2014   CLINICAL DATA:  81 year old female with abdominal pain.  EXAM: DG ABDOMEN ACUTE W/ 1V CHEST  COMPARISON:  None.  FINDINGS: There is emphysematous changes of the lungs. No focal consolidation, pleural effusion, or pneumothorax. The cardiomediastinal silhouette is within normal limits.  No intra-abdominal free air. There is diffuse air distention of the colon. Top-normal caliber air-filled loop of small bowel noted in the right hemiabdomen. CT may provide better evaluation if clinically indicated. For  multilevel degenerative changes of the spine. No acute fracture.  IMPRESSION: Diffuse air distention of the colon without definite evidence of small bowel obstruction.   Electronically Signed   By: Anner Crete M.D.   On: 07/17/2014 19:46   Assessment/Plan Principal Problem:   Colon cancer Active Problems:   PAF (paroxysmal atrial fibrillation)   Dementia   Loss of weight   Failure to thrive in adult   Protein-calorie malnutrition   1. Colon cancer The patient is presenting with complaints of abdominal pain with failure to thrive and weight loss. A CT of the abdomen is showing a possible mass which is circumferential concerning for colon cancer. Considering her age as well as her activity status the patient is not likely a decent candidate for any surgical or any aggressive chemotherapy outcome. The family has decided to call for keeping the patient comfortable without any aggressive measures. Therefore I will closely monitor her in the hospital. Palliative care consultation in the morning.  2. failure to thrive and added with 14-calorie malnutrition. Most likely secondary to cancer. Nutritional consult in the morning.  3. abdominal pain. The patient is currently pain-free. When necessary oxycodone.  Advance goals of care discussion: DNR/DNI    Consults: ER physician discussed with palliative care .  DVT Prophylaxis: subcutaneous Heparin Nutrition: Advance as tolerated   Family Communication: family was present at bedside, opportunity was given to ask question and all questions were answered satisfactorily at the time of interview. Disposition: Admitted as inpatient in telemetry med-surge unit.  Author: Berle Mull, MD [facility] Pager: [phone] 07/18/2014  If 7PM-7AM, please contact night-coverage [email or web address] Password [email or web address]

## 2014-07-18 NOTE — Progress Notes (Signed)
Pt seen and examined at bedside. Since pt seen after midnight, please see earlier admission note by Dr. Posey Pronto. Pt admitted for evaluation of abd pain and distension and imaging studies in ED notable for possible colon mass. Findings were discussed with family by admitting physician and family opted to proceed with conservative measures, supportive care, no further interventions regarding colon mass. Family also in agreement with palliative care consult which was requested. We'll follow-up on recommendations. Possible discharge home in 24-48 hours if patient feeling comfortable.  Faye Ramsay, MD  [facility] Pager [phone]  If 7PM-7AM, please contact night-coverage [email or web address] Password [email or web address]

## 2014-07-18 NOTE — Progress Notes (Signed)
Evaluated patient- orders placed for stool softener. Conservative management of her metastatic colon cancer is appropriate as is a home hospice referral- call placed to her daughter -awaiting return call. Patient is comfortable currently- full goals and recommendations to follow.  Lane Hacker, DO Palliative Medicine

## 2014-07-18 NOTE — Progress Notes (Signed)
PT Cancellation Note / Screen  Patient Details Name: Roberta White MRN: [id number] DOB: [birth year]   Cancelled Treatment:    Reason Eval/Treat Not Completed: PT screened, no needs identified, will sign off Per chart review, pt from home, ambulatory with RW however dependent for ADLs, hx of dementia.  No family present; Spoke with Dr. Hilma Favors who believes plan will likely be home with hospice.  Will defer home needs to hospice however please reorder if new needs arise.  Thanks you.   Kenae Lindquist,KATHrine E 07/18/2014, 11:45 AM Carmelia Bake, PT, DPT 07/18/2014 Pager: [phone]

## 2014-07-19 DIAGNOSIS — Z515 Encounter for palliative care: Secondary | ICD-10-CM

## 2014-07-19 LAB — URINE CULTURE: Culture: NO GROWTH

## 2014-07-19 MED ORDER — HYDRALAZINE HCL 20 MG/ML IJ SOLN
5.0000 mg | Freq: Four times a day (QID) | INTRAMUSCULAR | Status: DC | PRN
  Administered 2014-07-19: 5 mg via INTRAVENOUS
  Filled 2014-07-19: qty 1

## 2014-07-19 MED ORDER — HYDRALAZINE HCL 10 MG PO TABS
10.0000 mg | ORAL_TABLET | Freq: Three times a day (TID) | ORAL | Status: DC
  Filled 2014-07-19 (×9): qty 1

## 2014-07-19 MED ORDER — MORPHINE SULFATE (CONCENTRATE) 10 MG/0.5ML PO SOLN
10.0000 mg | ORAL | Status: DC | PRN
  Administered 2014-07-19 – 2014-07-21 (×6): 10 mg via ORAL
  Filled 2014-07-19 (×6): qty 0.5

## 2014-07-19 NOTE — Consult Note (Signed)
Consultation Note Date: 07/19/2014   Patient Name: Roberta White  DOB: 1917-11-15  MRN: [id number]  Age / Sex: 81 y.o., female   PCP: Nolene Ebbs, MD Referring Physician: Theodis Blaze, MD  Reason for Consultation: Terminal care  Palliative Care Assessment and Plan Summary of Established Goals of Care and Medical Treatment Preferences   Clinical Assessment/Narrative: 81 yo woman admitted from home after presenting to ED with 3 weeks of poor PO intake and abdominal pain. In ED Abdominal CT showed large partially obstructing mass in the sigmoid colon  with locally metastatic colon invlovement and lymph node enlargement. Goals discussed in ED by EDP and palliative consult placed. Attempted to meet with patient's daughter but she was unavailable yesterday. I met with patient and her daughter today to discuss next steps and goals of care.  Today Ms. Mowers looks worse, she is grimacing and having pain in her abdomen-she has eaten little if any food and appears to be declining. No BM. +abdomen is tender and distended.  Contacts/Participants in Discussion: Primary Decision Maker: daughter   HCPOA: no Daughter is main Media planner.  Code Status/Advance Care Planning:  DNR  Symptom Management:   Roxanol 69m q2 PRN pain  Palliative Prophylaxis: needs an aggressive bowel regimen  Additional Recommendations (Limitations, Scope, Preferences):  Full comfort care Psycho-social/Spiritual:   Support System: Has 5 hours of in home care daily ? PCS services, daughter works 11Am-3PM-is with her rest of the day- she has two son's but they are not involved in her care  Desire for further Chaplaincy support:yes  Prognosis: < 6 months  Discharge Planning:  Home with Hospice I discussed this with her daughter and provided information briefly on hospice services- I shared with her prognostic criteria as well.       Chief Complaint/History of Present Illness: 81yo with abdominal pain and poor  PO iontake  Primary Diagnoses  Present on Admission:  . Colon cancer . PAF (paroxysmal atrial fibrillation) . Dementia . Loss of weight . Failure to thrive in adult . Protein-calorie malnutrition  Palliative Review of Systems: na I have reviewed the medical record, interviewed the patient and family, and examined the patient. The following aspects are pertinent.  Past Medical History  Diagnosis Date  . DVT (deep venous thrombosis)   . Pulmonary embolism   . Dementia   . Diabetes mellitus without complication   . Dementia    History   Social History  . Marital Status: Single    Spouse Name: N/A  . Number of Children: N/A  . Years of Education: N/A   Social History Main Topics  . Smoking status: Never Smoker   . Smokeless tobacco: Not on file  . Alcohol Use: No  . Drug Use: No  . Sexual Activity: Not on file   Other Topics Concern  . None   Social History Narrative   Family History  Problem Relation Age of Onset  . Heart attack Neg Hx   . Heart failure Neg Hx    Scheduled Meds: . aspirin EC  81 mg Oral Daily  . docusate sodium  100 mg Oral BID  . famotidine  20 mg Oral Daily   Continuous Infusions: . sodium chloride 0.45 % with kcl 50 mL/hr at 07/19/14 1517   PRN Meds:.acetaminophen **OR** acetaminophen, hydrALAZINE, ondansetron **OR** ondansetron (ZOFRAN) IV, oxyCODONE, polyethylene glycol Medications Prior to Admission:  Prior to Admission medications   Medication Sig Start Date End Date Taking? Authorizing Provider  aspirin EC 81 MG EC tablet Take 1 tablet (81 mg total) by mouth daily. 12/18/13  Yes Brett Canales, PA-C  sucralfate (CARAFATE) 1 G tablet Take 1 tablet (1 g total) by mouth 4 (four) times daily -  with meals and at bedtime. For 2 weeks 06/27/14  Yes Melony Overly, MD   No Known Allergies CBC:    Component Value Date/Time   WBC 5.3 07/18/2014 0420   HGB 9.5* 07/18/2014 0420   HCT 29.6* 07/18/2014 0420   PLT 348 07/18/2014 0420   MCV  69.8* 07/18/2014 0420   NEUTROABS 4.3 07/17/2014 1954   LYMPHSABS 1.0 07/17/2014 1954   MONOABS 0.5 07/17/2014 1954   EOSABS 0.0 07/17/2014 1954   BASOSABS 0.0 07/17/2014 1954   Comprehensive Metabolic Panel:    Component Value Date/Time   NA 140 07/18/2014 0420   K 3.1* 07/18/2014 0420   CL 109 07/18/2014 0420   CO2 23 07/18/2014 0420   BUN 10 07/18/2014 0420   CREATININE 0.73 07/18/2014 0420   GLUCOSE 145* 07/18/2014 0420   CALCIUM 8.9 07/18/2014 0420   AST 21 07/18/2014 0420   ALT 10* 07/18/2014 0420   ALKPHOS 66 07/18/2014 0420   BILITOT 0.7 07/18/2014 0420   PROT 7.3 07/18/2014 0420   ALBUMIN 3.1* 07/18/2014 0420    Physical Exam: Vital Signs: BP 179/107 mmHg  Pulse 85  Temp(Src) 98.2 F (36.8 C) (Axillary)  Resp 18  Wt 36.741 kg (81 lb)  SpO2 100% SpO2: SpO2: 100 % O2 Device: O2 Device: Not Delivered O2 Flow Rate:   Intake/output summary:  Intake/Output Summary (Last 24 hours) at 07/19/14 1657 Last data filed at 07/19/14 1107  Gross per 24 hour  Intake    220 ml  Output      0 ml  Net    220 ml   LBM: Last BM Date: 07/15/14 Baseline Weight: Weight: 36.741 kg (81 lb) Most recent weight: Weight: 36.741 kg (81 lb)  Exam Findings:  +distended abdomen Lethargic, minimal conversation         Palliative Performance Scale: 20               Additional Data Reviewed: Recent Labs     07/17/14  1954  07/18/14  0420  WBC  5.9  5.3  HGB  9.9*  9.5*  PLT  331  348  NA  141  140  BUN  14  10  CREATININE  0.84  0.73     Time In: 4PM Time Out: 5PM Time Total: 60 minutes Greater than 50%  of this time was spent counseling and coordinating care related to the above assessment and plan.  Signed by: Roma Schanz, DO  07/19/2014, 4:57 PM  Please contact Palliative Medicine Team phone at [phone] for questions and concerns.

## 2014-07-19 NOTE — Progress Notes (Addendum)
Patient ID: Roberta White, female   DOB: [birth year], 81 y.o.   MRN: [id number]  [facility] PROGRESS NOTE  Leitha Hyppolite [id number]:[id number] DOB: 1917/01/21 DOA: 07/17/2014 PCP: Philis Fendt, MD   Brief narrative:    81 y.o. female with known dementia, a-fib, presented to [facility] ED with main concern of several weeks duration of progressive abd pain, nausea, poor oral intake, intermittent blood in stool. Work up in ED included CT abd and pelvis and has revealed proximal sigmoid colon mass concerning for malignancy and with local spread of the disease. Family has made it clear that no invasive intervention unless palliative are desired as comfort is the main goal.   Assessment/Plan:    Principal Problem:   Colon cancer, primary most likely  - based on CT abd/pelvis - d/w GI doctor on call Dr. Cristina Gong and he has also agreed with conservative management for now if pt is comfortable - if pt becomes symptomatic or in case of obstruction, palliative stenting can be considered  - follow upon PCT discussions and recommendations - provide analgesia as needed - continue bowel regimen   Active Problems:   PAF (paroxysmal atrial fibrillation) - rate controlled, not AC candidate due to advanced dementia, also pt with bleeding from likely colon cancer  - no need for telemetry monitoring     Dementia - at baseline     Accelerate HTN - place on Hydralazine scheduled and as needed    Hypokalemia - supplement and repeat BMP in AM    Failure to thrive in adult    Protein-calorie malnutrition, severe - in the context of chronic illness   DVT prophylaxis - SCD's  Code Status: DNR Family Communication:  Attempted to call family, awaiting call back  Disposition Plan: To be determined   IV access:  Peripheral IV  Procedures and diagnostic studies:    Ct Abdomen Pelvis W Contrast 07/17/2014  Circumferential mass noted at the proximal sigmoid colon, measuring 4.3 cm in length, compatible with primary  colonic malignancy. Colonoscopy and biopsy would be helpful for further evaluation, as deemed clinically appropriate. 2. Multiple adjacent small nodes within the mesentery, measuring up to 5 mm, likely reflecting local spread of disease. 3. The more proximal colon and distal small bowel are filled with fluid and air, without significant dilatation. The colonic mass is causing some degree of bowel dysmotility. 4. Chronic bilateral renal atrophy and scarring, with scattered parenchymal calcifications and bilateral renal cysts. 5. Bibasilar atelectasis noted. 6. Scattered calcification along the abdominal aorta and its branches, including at the origins of the renal arteries and superior mesenteric artery.  These results were called by telephone at the time of interpretation on 07/17/2014 at 11:53 pm to Dr. Varney Biles, who verbally acknowledged these results.    Dg Abd Acute W/chest 07/17/2014  Diffuse air distention of the colon without definite evidence of small bowel obstruction.   Medical Consultants:  PCT  Other Consultants:  None  IAnti-Infectives:   None  Faye Ramsay, MD  [facility] Pager [phone]  If 7PM-7AM, please contact night-coverage [email or web address] Password [email or web address] 07/19/2014, 1:17 PM   LOS: 1 day   HPI/Subjective: No events overnight.   Objective: Filed Vitals:   07/18/14 1310 07/18/14 2048 07/19/14 0552 07/19/14 0637  BP: 162/94 149/102 159/103 166/98  Pulse: 80 71 118 96  Temp: 98.2 F (36.8 C) 98 F (36.7 C) 97.6 F (36.4 C)   TempSrc: Oral Oral Oral   Resp: 20 18 20    Weight:  SpO2: 98% 94% 100%     Intake/Output Summary (Last 24 hours) at 07/19/14 1317 Last data filed at 07/19/14 1107  Gross per 24 hour  Intake    220 ml  Output      0 ml  Net    220 ml    Exam:   General:  Pt is alert, in mild distress due to pain   Cardiovascular: Regular rate and rhythm, no rubs, no gallops  Respiratory: Clear to auscultation bilaterally, no wheezing, no  crackles, no rhonchi  Abdomen: Soft, tender in epigastric area, slightly distended, bowel sounds faint   Data Reviewed: Basic Metabolic Panel:  Recent Labs Lab 07/17/14 1954 07/18/14 0420  NA 141 140  K 3.5 3.1*  CL 107 109  CO2 24 23  GLUCOSE 183* 145*  BUN 14 10  CREATININE 0.84 0.73  CALCIUM 9.3 8.9   Liver Function Tests:  Recent Labs Lab 07/17/14 1954 07/18/14 0420  AST 23 21  ALT 7* 10*  ALKPHOS 68 66  BILITOT 0.5 0.7  PROT 7.7 7.3  ALBUMIN 3.3* 3.1*    Recent Labs Lab 07/17/14 1954  LIPASE 14*   CBC:  Recent Labs Lab 07/17/14 1954 07/18/14 0420  WBC 5.9 5.3  NEUTROABS 4.3  --   HGB 9.9* 9.5*  HCT 30.4* 29.6*  MCV 70.7* 69.8*  PLT 331 348   Cardiac Enzymes:  Recent Labs Lab 07/17/14 1954  TROPONINI <0.03     Recent Results (from the past 240 hour(s))  Urine culture     Status: None   Collection Time: 07/17/14  7:13 PM  Result Value Ref Range Status   Specimen Description URINE, CLEAN CATCH  Final   Special Requests NONE  Final   Culture   Final    NO GROWTH 2 DAYS Performed at [facility]    Report Status 07/19/2014 FINAL  Final     Scheduled Meds: . aspirin EC  81 mg Oral Daily  . docusate sodium  100 mg Oral BID  . famotidine  20 mg Oral Daily   Continuous Infusions: . sodium chloride 0.45 % with kcl 50 mL/hr at 07/18/14 1159

## 2014-07-20 NOTE — Progress Notes (Signed)
This CM called daughter Celedonio Miyamoto to offer her choice of home hospice providers. Daughter plans to visit pt in a couple of hours and states she will call CM when she gets to the hospital to go over hospice provider list. Staff RN also encouraged to call this CM when daughter arrives to hospital. Marney Doctor RN,BSN,NCM [phone]

## 2014-07-20 NOTE — Progress Notes (Signed)
Patient ID: Roberta White, female   DOB: 1917/08/18, 81 y.o.   MRN: [id number]  [facility] PROGRESS NOTE  Roberta White [id number]:[id number] DOB: 03-12-1917 DOA: 07/17/2014 PCP: Philis Fendt, MD   Brief narrative:    81 y.o. female with known dementia, a-fib, presented to [facility] ED with main concern of several weeks duration of progressive abd pain, nausea, poor oral intake, intermittent blood in stool. Work up in ED included CT abd and pelvis and has revealed proximal sigmoid colon mass concerning for malignancy and with local spread of the disease. Family has made it clear that no invasive intervention unless palliative are desired as comfort is the main goal.   Assessment/Plan:    Principal Problem:   Colon cancer, primary most likely  - based on CT abd/pelvis - d/w GI doctor on call Dr. Cristina Gong and he has also agreed with conservative management for now if pt is comfortable - if pt becomes symptomatic or in case of obstruction, palliative stenting can be considered  - appreciate PCT assistance - continue to provide analgesia and bowel regimen - plan to d/c home in 1-2 days with hospice   Active Problems:   PAF (paroxysmal atrial fibrillation) - rate controlled, not AC candidate due to advanced dementia, also pt with bleeding from likely colon cancer  - no need for telemetry monitoring     Dementia - at baseline     Accelerate HTN - placed on Hydralazine scheduled and as needed    Hypokalemia - supplemented, no further blood work to ensure comfort     Failure to thrive in adult    Protein-calorie malnutrition, severe - in the context of chronic illness   DVT prophylaxis - SCD's  Code Status: DNR Family Communication:  Attempted to call family, awaiting call back  Disposition Plan: Home with hospice in 1-2 days   IV access:  Peripheral IV  Procedures and diagnostic studies:    Ct Abdomen Pelvis W Contrast 07/17/2014  Circumferential mass noted at the proximal sigmoid colon,  measuring 4.3 cm in length, compatible with primary colonic malignancy. Colonoscopy and biopsy would be helpful for further evaluation, as deemed clinically appropriate. 2. Multiple adjacent small nodes within the mesentery, measuring up to 5 mm, likely reflecting local spread of disease. 3. The more proximal colon and distal small bowel are filled with fluid and air, without significant dilatation. The colonic mass is causing some degree of bowel dysmotility. 4. Chronic bilateral renal atrophy and scarring, with scattered parenchymal calcifications and bilateral renal cysts. 5. Bibasilar atelectasis noted. 6. Scattered calcification along the abdominal aorta and its branches, including at the origins of the renal arteries and superior mesenteric artery.  These results were called by telephone at the time of interpretation on 07/17/2014 at 11:53 pm to Dr. Varney Biles, who verbally acknowledged these results.    Dg Abd Acute W/chest 07/17/2014  Diffuse air distention of the colon without definite evidence of small bowel obstruction.   Medical Consultants:  PCT GI doctor [place] over the phone   Other Consultants:  None  IAnti-Infectives:   None  Faye Ramsay, MD  [facility] Pager [phone]  If 7PM-7AM, please contact night-coverage [email or web address] Password [facility] 07/20/2014, 12:38 PM   LOS: 2 days   HPI/Subjective: No events overnight.   Objective: Filed Vitals:   07/19/14 1539 07/19/14 1710 07/19/14 1959 07/20/14 0615  BP: 179/107 145/85 129/75 141/76  Pulse: 85  86 84  Temp: 98.2 F (36.8 C)  97.1 F (36.2 C) 97.6 F (  36.4 C)  TempSrc: Axillary  Axillary Oral  Resp: 18  16 12   Weight:      SpO2: 100%  98% 99%    Intake/Output Summary (Last 24 hours) at 07/20/14 1238 Last data filed at 07/20/14 0600  Gross per 24 hour  Intake   1200 ml  Output      0 ml  Net   1200 ml    Exam:   General:  Pt is alert, more comfortable this AM   Cardiovascular: Regular rate and rhythm,  no rubs, no gallops  Respiratory: Clear to auscultation bilaterally, no wheezing, no crackles, no rhonchi  Abdomen: Soft, tender in epigastric area, distended, bowel sounds faint   Data Reviewed: Basic Metabolic Panel:  Recent Labs Lab 07/17/14 1954 07/18/14 0420  NA 141 140  K 3.5 3.1*  CL 107 109  CO2 24 23  GLUCOSE 183* 145*  BUN 14 10  CREATININE 0.84 0.73  CALCIUM 9.3 8.9   Liver Function Tests:  Recent Labs Lab 07/17/14 1954 07/18/14 0420  AST 23 21  ALT 7* 10*  ALKPHOS 68 66  BILITOT 0.5 0.7  PROT 7.7 7.3  ALBUMIN 3.3* 3.1*    Recent Labs Lab 07/17/14 1954  LIPASE 14*   CBC:  Recent Labs Lab 07/17/14 1954 07/18/14 0420  WBC 5.9 5.3  NEUTROABS 4.3  --   HGB 9.9* 9.5*  HCT 30.4* 29.6*  MCV 70.7* 69.8*  PLT 331 348   Cardiac Enzymes:  Recent Labs Lab 07/17/14 1954  TROPONINI <0.03     Recent Results (from the past 240 hour(s))  Urine culture     Status: None   Collection Time: 07/17/14  7:13 PM  Result Value Ref Range Status   Specimen Description URINE, CLEAN CATCH  Final   Special Requests NONE  Final   Culture   Final    NO GROWTH 2 DAYS Performed at [facility]    Report Status 07/19/2014 FINAL  Final     Scheduled Meds: . aspirin EC  81 mg Oral Daily  . docusate sodium  100 mg Oral BID  . famotidine  20 mg Oral Daily  . hydrALAZINE  10 mg Oral 3 times per day   Continuous Infusions: . sodium chloride 0.45 % with kcl 50 mL/hr at 07/19/14 1517

## 2014-07-20 NOTE — Care Management (Signed)
Important Message  Patient Details  Name: Roberta White MRN: [id number] Date of Birth: [birth year]   Medicare Important Message Given:  Yes-second notification given    Camillo Flaming 07/20/2014, 11:07 AM

## 2014-07-20 NOTE — Progress Notes (Addendum)
I recommend a hospice facility referral - she has had significant decrease in PO intake no solids only sips of fluid-she has also require pain PRNs. I believe that she is in the window of 2-4 week prognosis based on my assessment of her alertness and malignant obstruction symptoms- I would not recommend discharge to a SNF unless she is able to have full hospice care -she most certain CANNOT rehab and this would not be an appropriate way to car for this 81 yo woman with metastatic colon cancer who is approaching EOL. I will place CSW order for residential hospice referral. I briefly spoke to her daughter yesterday about this option but at the time I spoke with her daughter she reassured me she had her care covered at home- patients can continue to have PCS services and hospice now so between 5 hours of her caregiver and her daughter we agreed that she would be able to handle this- she may have reconsidered this option over night-I also shared with her the typical prognostic criteria for Hospice house. Considerable increased usage of pain PRNs in the past 24 hours.  Lane Hacker, DO Palliative Medicine [phone]

## 2014-07-20 NOTE — Plan of Care (Signed)
Problem: Phase I Progression Outcomes Goal: OOB as tolerated unless otherwise ordered Outcome: Progressing Pt is contracted

## 2014-07-20 NOTE — Progress Notes (Signed)
This CM spoke with daughter Jeanett Schlein who states that she cannot care for pt all the time at home, even with the help of home hospice services. Per MD pt is not eligible for residential hospice. This CM called MD to ask for PT order to see if pt could potentially qualify to go to SNF. CSW made aware and MD to write MD order. CM will continue to follow. CSW will follow up over the coming weekend. Marney Doctor RN,BSN,NCM [phone]

## 2014-07-21 MED ORDER — MORPHINE SULFATE (CONCENTRATE) 10 MG/0.5ML PO SOLN
10.0000 mg | ORAL | Status: DC
  Administered 2014-07-21 – 2014-07-22 (×5): 10 mg via ORAL
  Filled 2014-07-21 (×5): qty 0.5

## 2014-07-21 MED ORDER — CETYLPYRIDINIUM CHLORIDE 0.05 % MT LIQD: 7.0000 mL | Freq: Two times a day (BID) | OROMUCOSAL | Status: DC

## 2014-07-21 MED ORDER — CHLORHEXIDINE GLUCONATE 0.12 % MT SOLN
15.0000 mL | Freq: Two times a day (BID) | OROMUCOSAL | Status: DC
  Filled 2014-07-21 (×3): qty 15

## 2014-07-21 MED ORDER — BISACODYL 10 MG RE SUPP
10.0000 mg | Freq: Once | RECTAL | Status: AC
  Administered 2014-07-21: 10 mg via RECTAL
  Filled 2014-07-21: qty 1

## 2014-07-21 MED ORDER — MORPHINE SULFATE 2 MG/ML IJ SOLN
1.0000 mg | INTRAMUSCULAR | Status: DC | PRN
  Administered 2014-07-22: 1 mg via INTRAVENOUS
  Filled 2014-07-21: qty 1

## 2014-07-21 MED ORDER — HALOPERIDOL LACTATE 5 MG/ML IJ SOLN: 1.0000 mg | Freq: Four times a day (QID) | INTRAMUSCULAR | Status: DC | PRN

## 2014-07-21 NOTE — Progress Notes (Signed)
Patient ID: Roberta White, female   DOB: September 26, 1917, 81 y.o.   MRN: [id number]  [facility] PROGRESS NOTE  Lekisha Mcghee [id number] DOB: [birth year] DOA: 07/17/2014 PCP: Philis Fendt, MD   Brief narrative:    81 y.o. female with known dementia, a-fib, presented to [facility] ED with main concern of several weeks duration of progressive abd pain, nausea, poor oral intake, intermittent blood in stool. Work up in ED included CT abd and pelvis and has revealed proximal sigmoid colon mass concerning for malignancy and with local spread of the disease. Family has made it clear that no invasive intervention unless palliative are desired as comfort is the main goal.   Assessment/Plan:    Principal Problem:   Colon cancer, primary most likely  - based on CT abd/pelvis - d/w GI doctor on call Dr. Cristina Gong and he has also agreed with conservative management for now if pt is comfortable - plan to d/c to residential hospice possibly in AM if bed available   Active Problems:   PAF (paroxysmal atrial fibrillation) - rate controlled, not AC candidate due to advanced dementia, also pt with bleeding from likely colon cancer  - no need for telemetry monitoring     Dementia - at baseline     Accelerate HTN - placed on Hydralazine scheduled and as needed    Hypokalemia - supplemented, no further blood work to ensure comfort     Failure to thrive in adult    Protein-calorie malnutrition, severe - in the context of chronic illness   DVT prophylaxis - SCD's  Code Status: DNR Family Communication:  Attempted to call family, awaiting call back  Disposition Plan: Residential hospice   IV access:  Peripheral IV  Procedures and diagnostic studies:    Ct Abdomen Pelvis W Contrast 07/17/2014  Circumferential mass noted at the proximal sigmoid colon, measuring 4.3 cm in length, compatible with primary colonic malignancy. Colonoscopy and biopsy would be helpful for further evaluation, as deemed clinically  appropriate. 2. Multiple adjacent small nodes within the mesentery, measuring up to 5 mm, likely reflecting local spread of disease. 3. The more proximal colon and distal small bowel are filled with fluid and air, without significant dilatation. The colonic mass is causing some degree of bowel dysmotility. 4. Chronic bilateral renal atrophy and scarring, with scattered parenchymal calcifications and bilateral renal cysts. 5. Bibasilar atelectasis noted. 6. Scattered calcification along the abdominal aorta and its branches, including at the origins of the renal arteries and superior mesenteric artery.  These results were called by telephone at the time of interpretation on 07/17/2014 at 11:53 pm to Dr. Varney Biles, who verbally acknowledged these results.    Dg Abd Acute W/chest 07/17/2014  Diffuse air distention of the colon without definite evidence of small bowel obstruction.   Medical Consultants:  PCT GI doctor [place] over the phone   Other Consultants:  None  IAnti-Infectives:   None  Faye Ramsay, MD  [facility] Pager [phone]  If 7PM-7AM, please contact night-coverage [email or web address] Password [email or web address] 07/21/2014, 2:19 PM   LOS: 3 days   HPI/Subjective: No events overnight.   Objective: Filed Vitals:   07/20/14 1417 07/20/14 2130 07/20/14 2135 07/21/14 0646  BP: 122/72 142/87 142/87 118/71  Pulse: 82  111 100  Temp: 97.6 F (36.4 C)  96.6 F (35.9 C) 97.9 F (36.6 C)  TempSrc: Oral  Axillary Axillary  Resp: 16  16 16   Weight:      SpO2: 99%  91% 93%  Intake/Output Summary (Last 24 hours) at 07/21/14 1419 Last data filed at 07/21/14 0900  Gross per 24 hour  Intake    880 ml  Output      0 ml  Net    880 ml    Exam:   General:  Pt is resting   Cardiovascular: Regular rate and rhythm, no rubs, no gallops  Respiratory: Clear to auscultation bilaterally, diminished breath sounds   Abdomen: distended, bowel sounds faint   Data Reviewed: Basic Metabolic  Panel:  Recent Labs Lab 07/17/14 1954 07/18/14 0420  NA 141 140  K 3.5 3.1*  CL 107 109  CO2 24 23  GLUCOSE 183* 145*  BUN 14 10  CREATININE 0.84 0.73  CALCIUM 9.3 8.9   Liver Function Tests:  Recent Labs Lab 07/17/14 1954 07/18/14 0420  AST 23 21  ALT 7* 10*  ALKPHOS 68 66  BILITOT 0.5 0.7  PROT 7.7 7.3  ALBUMIN 3.3* 3.1*    Recent Labs Lab 07/17/14 1954  LIPASE 14*   CBC:  Recent Labs Lab 07/17/14 1954 07/18/14 0420  WBC 5.9 5.3  NEUTROABS 4.3  --   HGB 9.9* 9.5*  HCT 30.4* 29.6*  MCV 70.7* 69.8*  PLT 331 348   Cardiac Enzymes:  Recent Labs Lab 07/17/14 1954  TROPONINI <0.03     Recent Results (from the past 240 hour(s))  Urine culture     Status: None   Collection Time: 07/17/14  7:13 PM  Result Value Ref Range Status   Specimen Description URINE, CLEAN CATCH  Final   Special Requests NONE  Final   Culture   Final    NO GROWTH 2 DAYS Performed at [facility]    Report Status 07/19/2014 FINAL  Final     Scheduled Meds: . aspirin EC  81 mg Oral Daily  . docusate sodium  100 mg Oral BID  . famotidine  20 mg Oral Daily  . hydrALAZINE  10 mg Oral 3 times per day   Continuous Infusions: . sodium chloride 0.45 % with kcl 50 mL/hr at 07/21/14 1211

## 2014-07-21 NOTE — Clinical Social Work Note (Signed)
CSW spoke with Pender who confirmed that pt has a bed at [facility] and the family signed the required paperwork today  CSW will facilitate transfer to [facility] tomorrow  .Dede Query, LCSW [facility] Clinical Social Worker - Weekend Coverage cell #: [phone]

## 2014-07-21 NOTE — Progress Notes (Signed)
[facility] Liaison-RN Visit  Received a request from [place], hospital social worker, for family interest in [facility] for transfer tomorrow. Chart reviewed and received report from bedside RN and PMT.  Met with daughter, Jeanett Schlein to confirm interest and explain services.  Eligibility confirmed with hospice MD, Dr Tomasa Hosteller. Family agreeable to transfer tomorrow.  Rollene Fare made aware.  Registration paperwork completed with Jeanett Schlein.  Dr. Orpah Melter to assume care per family request.  Please fax discharge summary to [phone].  RN please call report to [phone].  Please arrange PTAR to arrive before noon if possible.  Please call with any questions.  Thank you. Annia Belt RN, BSN [facility] Liaison [phone]

## 2014-07-21 NOTE — Clinical Social Work Note (Signed)
CSW received a consult for residential hospice  Per RN/Case Manager pt and family want [place]  CSW called the liaison for [facility] and was told to call the on call number  CSW called the on call number and was told someone would call her back  CSW received a call from [place] triage nurse with [facility] who stated that they did not have a bed today but would call clinical manager and have them call CSW  CSW faxed pt information over to [place] spoke with liaison for [facility] who stated that she would follow up with family and hopefully have a bed tomorrow.  CSW will continue to follow pt until discharge  .Dede Query, LCSW [facility] Clinical Social Worker - Weekend Coverage cell #: [phone]

## 2014-07-21 NOTE — Care Management Note (Signed)
Case Management Note  Patient Details  Name: Roberta White MRN: [id number] Date of Birth: Jul 01, 1917  Subjective/Objective:     Colon cancer               Action/Plan: Home  Expected Discharge Date:  07/21/2014              Expected Discharge Plan:  [place]  In-House Referral:  Clinical Social Work  Discharge planning Services  CM Consult  Post Acute Care Choice:    Choice offered to:     DME Arranged:    DME Agency:     HH Arranged:    [place] Agency:     Status of Service:  Completed, signed off  Medicare Important Message Given:  Yes-second notification given Date Medicare IM Given:    Medicare IM give by:    Date Additional Medicare IM Given:    Additional Medicare Important Message give by:     If discussed at [place] of Stay Meetings, dates discussed:    Additional Comments: 07/21/2014 1100 Referal sent to CSW for Residential Hospice.  Erenest Rasher, RN 07/21/2014, 2:26 PM

## 2014-07-21 NOTE — Progress Notes (Signed)
PT Cancellation Note---second screen/PT SIGNING OFF again  Patient Details Name: Roberta White MRN: [id number] DOB: 1917-09-20   Cancelled Treatment:    Reason Eval/Treat Not Completed: PT screened, no needs identified, will sign off; PLEASE SEE DR. ELIZABETH GOLDING'S NOTE FROM 07/20/14; [place], PT   [facility] 07/21/2014, 9:13 AM

## 2014-07-22 MED ORDER — MORPHINE SULFATE (CONCENTRATE) 10 MG/0.5ML PO SOLN: 10.0000 mg | ORAL | Status: AC | PRN

## 2014-07-22 MED ORDER — ONDANSETRON 4 MG PO TBDP: 4.0000 mg | ORAL_TABLET | Freq: Three times a day (TID) | ORAL | Status: AC | PRN

## 2014-07-22 NOTE — Clinical Social Work Placement (Signed)
   CLINICAL SOCIAL WORK PLACEMENT  NOTE  Date:  07/22/2014  Patient Details  Name: Roberta White MRN: [id number] Date of Birth: 12-11-1917  Clinical Social Work is seeking post-discharge placement for this patient at the  [facility]) level of care (*CSW will initial, date and re-position this form in  chart as items are completed):      Patient/family provided with [place] Work Department's list of facilities offering this level of care within the geographic area requested by the patient (or if unable, by the patient's family).      Patient/family informed of their freedom to choose among providers that offer the needed level of care, that participate in Medicare, Medicaid or managed care program needed by the patient, have an available bed and are willing to accept the patient.      Patient/family informed of [place]'s ownership interest in [facility] and [facility], as well as of the fact that they are under no obligation to receive care at these facilities.  PASRR submitted to EDS on       PASRR number received on       Existing PASRR number confirmed on       FL2 transmitted to all facilities in geographic area requested by pt/family on       FL2 transmitted to all facilities within larger geographic area on       Patient informed that his/her managed care company has contracts with or will negotiate with certain facilities, including the following:            Patient/family informed of bed offers received.  Patient chooses bed at  [facility])     Physician recommends and patient chooses bed at      Patient to be transferred to  [facility]) on  .  Patient to be transferred to facility by  (Ambulance)     Patient family notified on 07/22/14 of transfer.  Name of family member notified:   (Jeanette/daughter)     PHYSICIAN       Additional Comment:    _______________________________________________ Carlean Jews,  LCSW 07/22/2014, 3:32 PM

## 2014-07-22 NOTE — Progress Notes (Signed)
OT Cancellation Note  Patient Details Name: Makalia Bare MRN: [id number] DOB: 01/19/1917   Cancelled Treatment:    Reason Eval/Treat Not Completed: OT screened, no needs identified, will sign off Noted transfer to [facility] place.  Betsy Pries 07/22/2014, 11:09 AM

## 2014-07-22 NOTE — Discharge Instructions (Signed)

## 2014-07-22 NOTE — Progress Notes (Signed)
Patient discharged to [facility] via [place], discharge report called to Doreen Salvage at [facility].

## 2014-07-22 NOTE — Discharge Summary (Signed)
Physician Discharge Summary  Malonie Tatum GEX:[id number] DOB: 01-02-1918 DOA: 07/17/2014  PCP: Philis Fendt, MD  Admit date: 07/17/2014 Discharge date: 07/22/2014  Recommendations for Outpatient Follow-up:  1. Pt to be discharged to [facility]   Discharge Diagnoses:  Principal Problem:   Colon cancer Active Problems:   PAF (paroxysmal atrial fibrillation)   Dementia   Loss of weight   Failure to thrive in adult   Protein-calorie malnutrition  Discharge Condition: Stable  Diet recommendation: Comfort feeding as pt able to tolerate   Brief narrative:    81 y.o. female with known dementia, a-fib, presented to [facility] ED with main concern of several weeks duration of progressive abd pain, nausea, poor oral intake, intermittent blood in stool. Work up in ED included CT abd and pelvis and has revealed proximal sigmoid colon mass concerning for malignancy and with local spread of the disease. Family has made it clear that no invasive intervention unless palliative are desired as comfort is the main goal.   Assessment/Plan:    Principal Problem:  Colon cancer, primary most likely  - based on CT abd/pelvis - d/w GI doctor on call Dr. Cristina Gong and he has also agreed with conservative management for now if pt is comfortable - plan to d/c to residential hospice with continuation of comfort care   Active Problems:  PAF (paroxysmal atrial fibrillation) - rate controlled, not AC candidate due to advanced dementia, also pt with bleeding from likely colon cancer  - no need for telemetry monitoring    Dementia - at baseline    Accelerate HTN - has been reasonably stable    Hypokalemia - supplemented, no further blood work to ensure comfort    Failure to thrive in adult   Protein-calorie malnutrition, severe - in the context of chronic illness       Discharge Exam: Filed Vitals:   07/22/14 0436  BP: 120/73  Pulse:   Temp: 97.6 F (36.4 C)  Resp: 16   Filed Vitals:    07/21/14 1420 07/21/14 1900 07/21/14 2105 07/22/14 0436  BP: 122/70  115/75 120/73  Pulse: 99  99   Temp: 97.3 F (36.3 C)   97.6 F (36.4 C)  TempSrc: Axillary  Axillary Axillary  Resp: 14  16 16   Height:  4\' 4"  (1.321 m)    Weight:      SpO2: 93%  83% 99%    General: Pt is alert, teary eyed, denies pain Cardiovascular: Regular rate and rhythm, S1/S2 +, no murmurs, no rubs, no gallops Respiratory: Clear to auscultation bilaterally, no wheezing, diminished breath sounds at bases  Abdominal: Soft, non tender, non distended, bowel sounds +, no guarding   Discharge Instructions    Medication List    STOP taking these medications        aspirin 81 MG EC tablet     sucralfate 1 G tablet  Commonly known as:  CARAFATE      TAKE these medications        morphine CONCENTRATE 10 MG/0.5ML Soln concentrated solution  Take 0.5 mLs (10 mg total) by mouth every 3 (three) hours as needed for moderate pain, severe pain or shortness of breath.     ondansetron 4 MG disintegrating tablet  Commonly known as:  ZOFRAN ODT  Take 1 tablet (4 mg total) by mouth every 8 (eight) hours as needed for nausea or vomiting.          The results of significant diagnostics from this hospitalization (including imaging,  microbiology, ancillary and laboratory) are listed below for reference.     Microbiology: Recent Results (from the past 240 hour(s))  Urine culture     Status: None   Collection Time: 07/17/14  7:13 PM  Result Value Ref Range Status   Specimen Description URINE, CLEAN CATCH  Final   Special Requests NONE  Final   Culture   Final    NO GROWTH 2 DAYS Performed at [facility]    Report Status 07/19/2014 FINAL  Final     Labs: Basic Metabolic Panel:  Recent Labs Lab 07/17/14 1954 07/18/14 0420  NA 141 140  K 3.5 3.1*  CL 107 109  CO2 24 23  GLUCOSE 183* 145*  BUN 14 10  CREATININE 0.84 0.73  CALCIUM 9.3 8.9   Liver Function Tests:  Recent Labs Lab  07/17/14 1954 07/18/14 0420  AST 23 21  ALT 7* 10*  ALKPHOS 68 66  BILITOT 0.5 0.7  PROT 7.7 7.3  ALBUMIN 3.3* 3.1*    Recent Labs Lab 07/17/14 1954  LIPASE 14*   CBC:  Recent Labs Lab 07/17/14 1954 07/18/14 0420  WBC 5.9 5.3  NEUTROABS 4.3  --   HGB 9.9* 9.5*  HCT 30.4* 29.6*  MCV 70.7* 69.8*  PLT 331 348   Cardiac Enzymes:  Recent Labs Lab 07/17/14 1954  TROPONINI <0.03    ProBNP (last 3 results)  Recent Labs  12/16/13 1705  PROBNP 781.1*    SIGNED: Time coordinating discharge: 30 minutes  Faye Ramsay, MD  [facility] 07/22/2014, 8:40 AM Pager [phone]  If 7PM-7AM, please contact night-coverage [email or web address] Password [email or web address]

## 2014-07-22 NOTE — Progress Notes (Signed)
   Daily Progress Note   Patient Name: Roberta White       Date: 07/22/2014 DOB: June 13, 1917  Age: 81 y.o. MRN#: [id number] Attending Physician: Theodis Blaze, MD Primary Care Physician: Philis Fendt, MD Admit Date: 07/17/2014  Reason for Consultation/Follow-up: Disposition  Subjective: Much more unresponsive, no PO intake, pain with moving and repositioning. No BM Interval Events: Dx Metastatic colon Ca on 7/8 Length of Stay: 4 days  Current Medications: Scheduled Meds:  . antiseptic oral rinse  7 mL Mouth Rinse q12n4p  . chlorhexidine  15 mL Mouth Rinse BID  . morphine CONCENTRATE  10 mg Oral 6 times per day    Continuous Infusions: . sodium chloride 0.45 % with kcl 50 mL/hr at 07/22/14 0907    PRN Meds: acetaminophen **OR** acetaminophen, haloperidol lactate, morphine injection, ondansetron **OR** ondansetron (ZOFRAN) IV, polyethylene glycol  Palliative Performance Scale: 20%     Vital Signs: BP 120/73 mmHg  Pulse 99  Temp(Src) 97.6 F (36.4 C) (Axillary)  Resp 16  Ht 4\' 4"  (1.321 m)  Wt 36.741 kg (81 lb)  SpO2 99% SpO2: SpO2: 99 % O2 Device: O2 Device: Not Delivered O2 Flow Rate:    Intake/output summary:  Intake/Output Summary (Last 24 hours) at 07/22/14 1220 Last data filed at 07/22/14 1015  Gross per 24 hour  Intake    400 ml  Output    451 ml  Net    -51 ml   LBM:   Baseline Weight: Weight: 36.741 kg (81 lb) Most recent weight: Weight: 36.741 kg (81 lb)  Physical Exam: Frail cachectic, distended abdomen     Additional Data Reviewed: No results for input(s): WBC, HGB, PLT, NA, BUN, CREATININE, ALB in the last 72 hours.   Problem List:  Patient Active Problem List   Diagnosis Date Noted  . Colon cancer 07/18/2014  . Dementia 07/18/2014  . Loss of weight 07/18/2014  . Failure to thrive in adult 07/18/2014  . Protein-calorie malnutrition 07/18/2014  . Second degree AV block, Mobitz type I 12/18/2013  . Chest pain 12/18/2013  . PAF (paroxysmal  atrial fibrillation) 12/18/2013  . Tachy-brady syndrome 12/18/2013  . Sinus bradycardia 12/16/2013     Palliative Care Assessment & Plan    Code Status:  DNR  Goals of Care:  Full comfort care  Desire for further Chaplaincy support:yes  3. Symptom Management:  Morphine scheduled Roxanol q4 hours and IV morphine q2 PRN breakthrough pain  Place a Foley  Give dulcolax suppository  4. Palliative Prophylaxis:  Stool Softner: yes  5. Prognosis: < 2 weeks  5. Discharge Planning: Hospice facility   Care plan was discussed with Daughter- extensive conversation at bedside- I told daughter that her mother was dying- she heard this-but I am not certain how much she fully understands.Poor support system in general.  Thank you for allowing the Palliative Medicine Team to assist in the care of this patient. Time: 3PM-3:35PM 35 minutes   Greater than 50%  of this time was spent counseling and coordinating care related to the above assessment and plan.   Acquanetta Chain, DO  07/22/2014, 12:20 PM  Please contact Palliative Medicine Team phone at [phone] for questions and concerns.

## 2014-08-13 DEATH — deceased

## 2015-12-25 IMAGING — CR DG ABDOMEN ACUTE W/ 1V CHEST
4 series · 4 of 4 positions shown · non-contrast
Comparison: None.

CLINICAL DATA: [AGE] female with abdominal pain.

EXAM:
DG ABDOMEN ACUTE W/ 1V CHEST

[w abdomen decub]
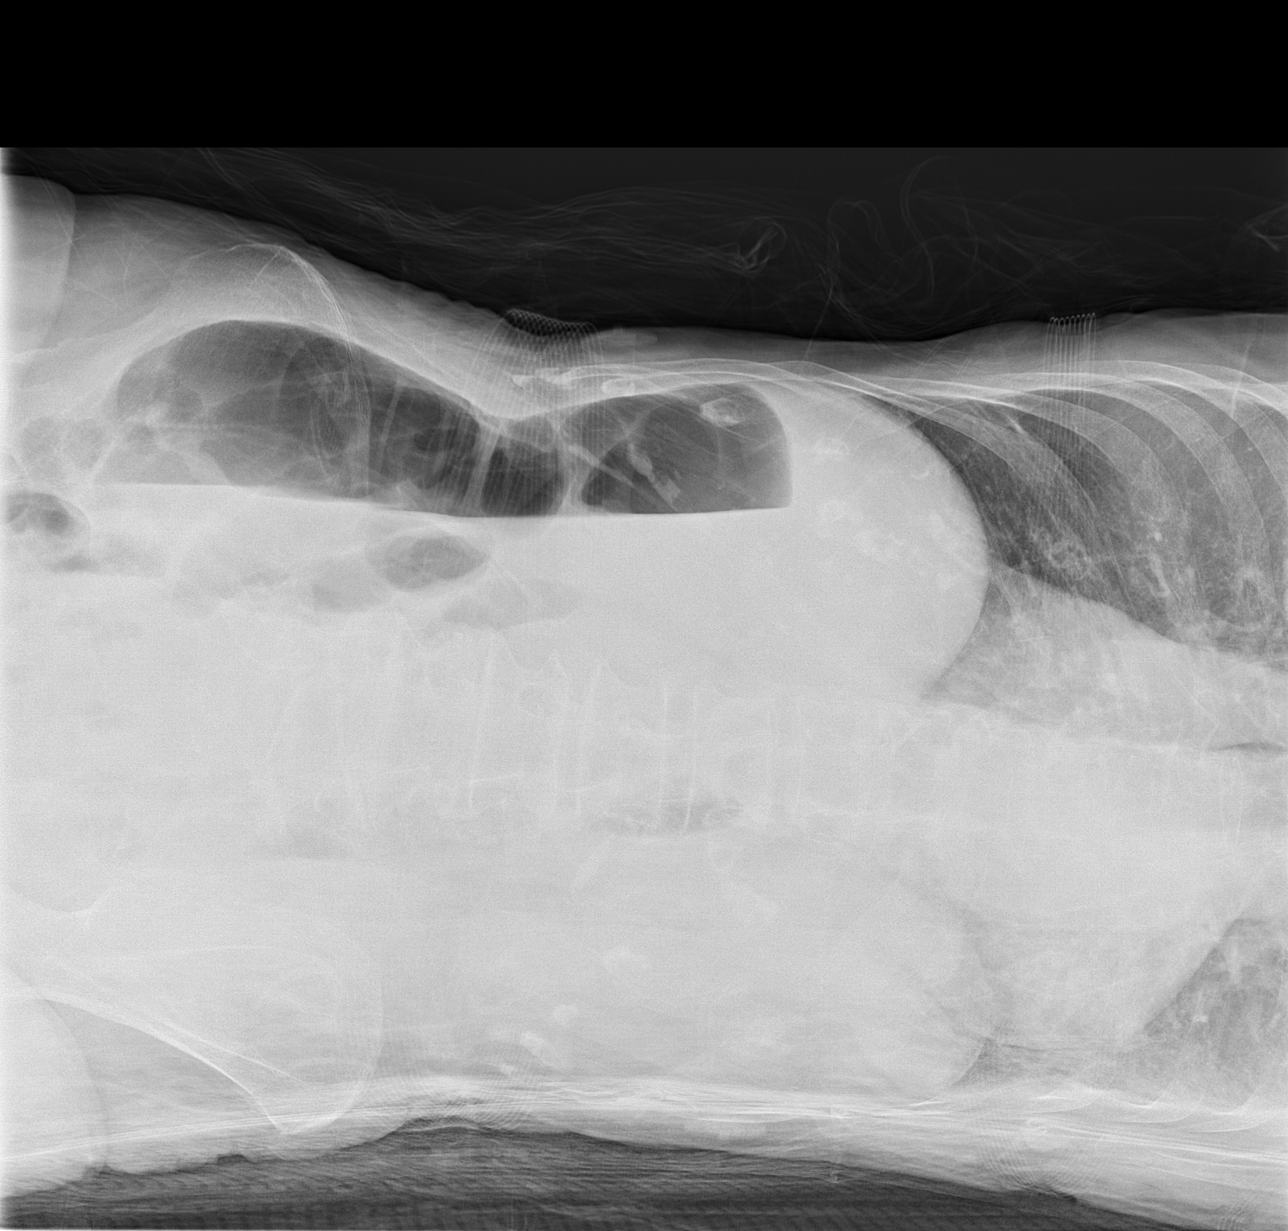

[x abdomen supine (1 of 2)]
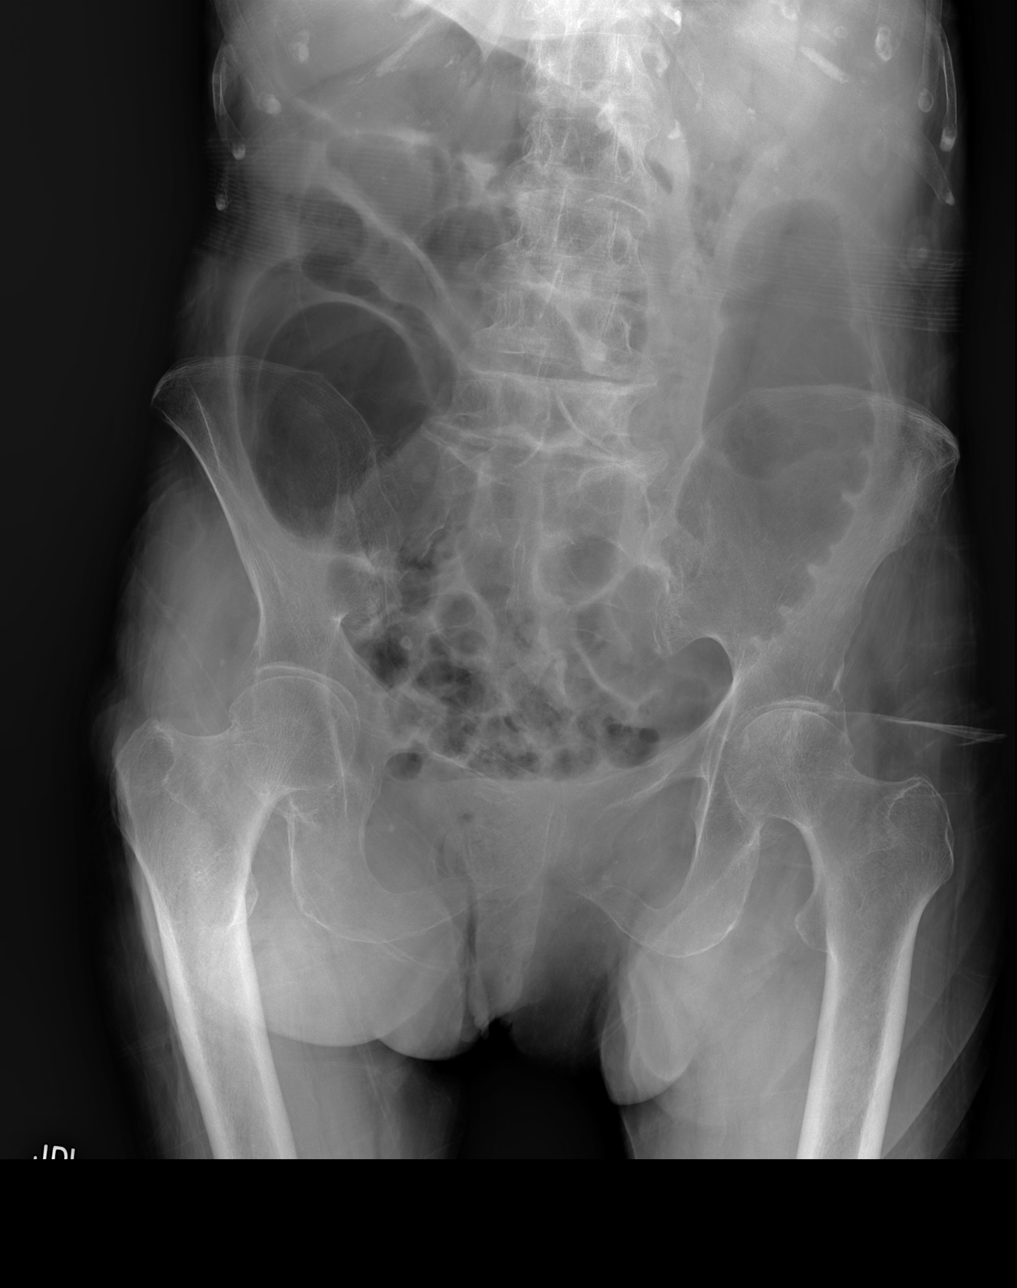

[x abdomen supine (2 of 2)]
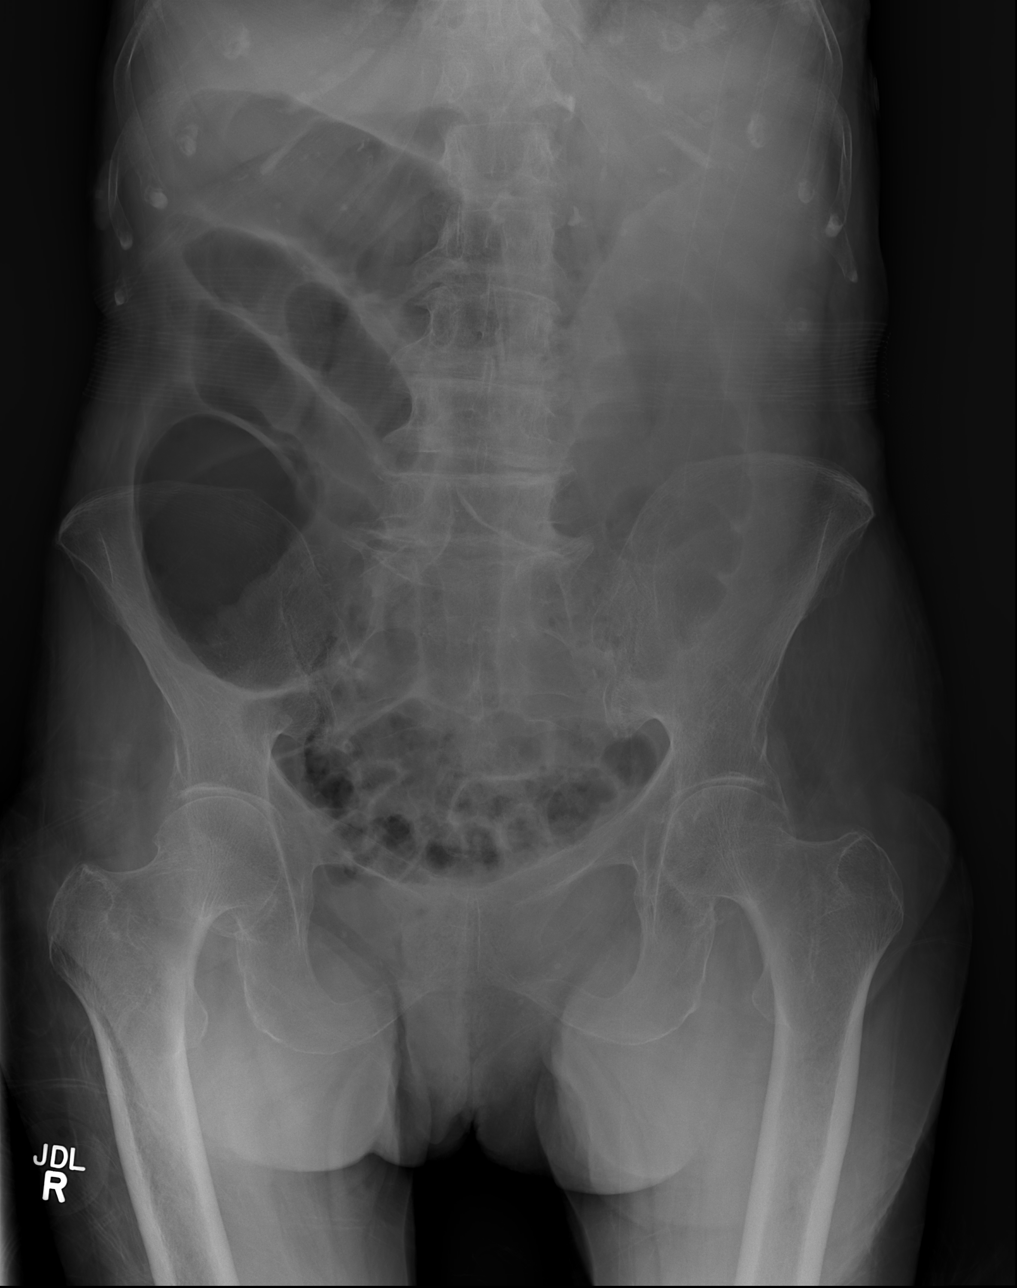

[x chest ap]
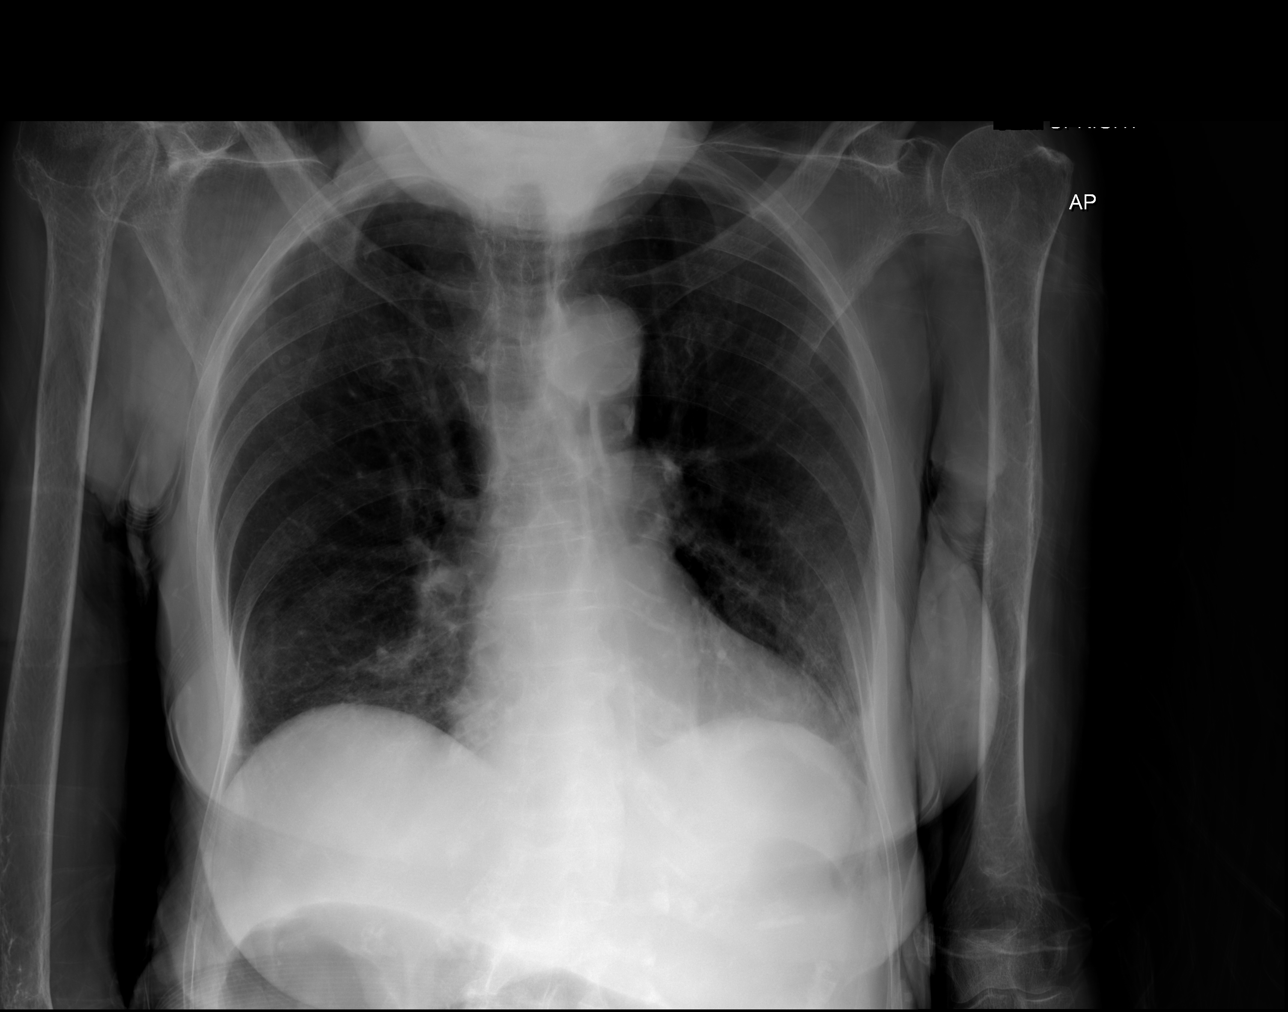

[4 of 4 positions shown; findings below may reference images not displayed]

FINDINGS: There is emphysematous changes of the lungs. No focal consolidation,
pleural effusion, or pneumothorax. The cardiomediastinal silhouette
is within normal limits.

No intra-abdominal free air. There is diffuse air distention of the
colon. Top-normal caliber air-filled loop of small bowel noted in
the right hemiabdomen. CT may provide better evaluation if
clinically indicated. For multilevel degenerative changes of the
spine. No acute fracture.
IMPRESSION: Diffuse air distention of the colon without definite evidence of
small bowel obstruction.
